# Patient Record
Sex: Female | Born: 1955 | Race: White | Hispanic: No | Marital: Married | Smoking: Never smoker
Health system: Southern US, Community
[De-identification: ages and names within clinical notes are randomized; demographics above are authoritative.]

## PROBLEM LIST (undated history)

## (undated) DIAGNOSIS — K219 Gastro-esophageal reflux disease without esophagitis: Secondary | ICD-10-CM

## (undated) DIAGNOSIS — R011 Cardiac murmur, unspecified: Secondary | ICD-10-CM

## (undated) DIAGNOSIS — J45909 Unspecified asthma, uncomplicated: Secondary | ICD-10-CM

## (undated) DIAGNOSIS — T7840XA Allergy, unspecified, initial encounter: Secondary | ICD-10-CM

## (undated) DIAGNOSIS — E785 Hyperlipidemia, unspecified: Secondary | ICD-10-CM

## (undated) DIAGNOSIS — Z5189 Encounter for other specified aftercare: Secondary | ICD-10-CM

## (undated) DIAGNOSIS — E079 Disorder of thyroid, unspecified: Secondary | ICD-10-CM

## (undated) DIAGNOSIS — M1990 Unspecified osteoarthritis, unspecified site: Secondary | ICD-10-CM

## (undated) DIAGNOSIS — I10 Essential (primary) hypertension: Secondary | ICD-10-CM

## (undated) HISTORY — PX: APPENDECTOMY: SHX54

## (undated) HISTORY — DX: Unspecified osteoarthritis, unspecified site: M19.90

## (undated) HISTORY — PX: ABDOMINAL HYSTERECTOMY: SHX81

## (undated) HISTORY — DX: Disorder of thyroid, unspecified: E07.9

## (undated) HISTORY — DX: Essential (primary) hypertension: I10

## (undated) HISTORY — DX: Allergy, unspecified, initial encounter: T78.40XA

## (undated) HISTORY — DX: Unspecified asthma, uncomplicated: J45.909

## (undated) HISTORY — DX: Hyperlipidemia, unspecified: E78.5

## (undated) HISTORY — DX: Encounter for other specified aftercare: Z51.89

## (undated) HISTORY — DX: Gastro-esophageal reflux disease without esophagitis: K21.9

## (undated) HISTORY — DX: Cardiac murmur, unspecified: R01.1

---

## 2016-07-27 DIAGNOSIS — Z23 Encounter for immunization: Secondary | ICD-10-CM | POA: Diagnosis not present

## 2016-08-01 DIAGNOSIS — I10 Essential (primary) hypertension: Secondary | ICD-10-CM | POA: Diagnosis not present

## 2016-08-01 DIAGNOSIS — M170 Bilateral primary osteoarthritis of knee: Secondary | ICD-10-CM | POA: Diagnosis not present

## 2016-08-01 DIAGNOSIS — Z23 Encounter for immunization: Secondary | ICD-10-CM | POA: Diagnosis not present

## 2016-08-01 DIAGNOSIS — E039 Hypothyroidism, unspecified: Secondary | ICD-10-CM | POA: Diagnosis not present

## 2016-08-01 DIAGNOSIS — E7800 Pure hypercholesterolemia, unspecified: Secondary | ICD-10-CM | POA: Diagnosis not present

## 2016-09-06 DIAGNOSIS — D490 Neoplasm of unspecified behavior of digestive system: Secondary | ICD-10-CM | POA: Diagnosis not present

## 2016-09-06 DIAGNOSIS — D126 Benign neoplasm of colon, unspecified: Secondary | ICD-10-CM | POA: Insufficient documentation

## 2016-09-06 DIAGNOSIS — Z800 Family history of malignant neoplasm of digestive organs: Secondary | ICD-10-CM | POA: Diagnosis not present

## 2016-09-06 DIAGNOSIS — K6289 Other specified diseases of anus and rectum: Secondary | ICD-10-CM | POA: Diagnosis not present

## 2016-09-06 DIAGNOSIS — D123 Benign neoplasm of transverse colon: Secondary | ICD-10-CM | POA: Diagnosis not present

## 2016-09-06 DIAGNOSIS — Z1211 Encounter for screening for malignant neoplasm of colon: Secondary | ICD-10-CM | POA: Diagnosis not present

## 2016-09-07 DIAGNOSIS — K629 Disease of anus and rectum, unspecified: Secondary | ICD-10-CM | POA: Diagnosis not present

## 2016-09-07 DIAGNOSIS — K6289 Other specified diseases of anus and rectum: Secondary | ICD-10-CM | POA: Insufficient documentation

## 2016-09-17 DIAGNOSIS — E039 Hypothyroidism, unspecified: Secondary | ICD-10-CM | POA: Diagnosis not present

## 2016-09-17 DIAGNOSIS — I10 Essential (primary) hypertension: Secondary | ICD-10-CM | POA: Diagnosis not present

## 2016-09-17 DIAGNOSIS — Z6841 Body Mass Index (BMI) 40.0 and over, adult: Secondary | ICD-10-CM | POA: Diagnosis not present

## 2016-09-17 DIAGNOSIS — D128 Benign neoplasm of rectum: Secondary | ICD-10-CM | POA: Diagnosis not present

## 2016-09-17 DIAGNOSIS — Z01818 Encounter for other preprocedural examination: Secondary | ICD-10-CM | POA: Diagnosis not present

## 2016-09-19 DIAGNOSIS — D128 Benign neoplasm of rectum: Secondary | ICD-10-CM | POA: Diagnosis not present

## 2016-09-19 DIAGNOSIS — E039 Hypothyroidism, unspecified: Secondary | ICD-10-CM | POA: Diagnosis not present

## 2016-09-19 DIAGNOSIS — Z6839 Body mass index (BMI) 39.0-39.9, adult: Secondary | ICD-10-CM | POA: Diagnosis not present

## 2016-09-19 DIAGNOSIS — K629 Disease of anus and rectum, unspecified: Secondary | ICD-10-CM | POA: Diagnosis not present

## 2016-09-19 DIAGNOSIS — Z79899 Other long term (current) drug therapy: Secondary | ICD-10-CM | POA: Diagnosis not present

## 2016-09-19 DIAGNOSIS — I10 Essential (primary) hypertension: Secondary | ICD-10-CM | POA: Diagnosis not present

## 2016-09-19 DIAGNOSIS — Z791 Long term (current) use of non-steroidal anti-inflammatories (NSAID): Secondary | ICD-10-CM | POA: Diagnosis not present

## 2016-09-20 DIAGNOSIS — D128 Benign neoplasm of rectum: Secondary | ICD-10-CM | POA: Diagnosis not present

## 2016-09-20 DIAGNOSIS — Z79899 Other long term (current) drug therapy: Secondary | ICD-10-CM | POA: Diagnosis not present

## 2016-09-20 DIAGNOSIS — Z791 Long term (current) use of non-steroidal anti-inflammatories (NSAID): Secondary | ICD-10-CM | POA: Diagnosis not present

## 2016-09-20 DIAGNOSIS — E039 Hypothyroidism, unspecified: Secondary | ICD-10-CM | POA: Diagnosis not present

## 2016-09-20 DIAGNOSIS — I10 Essential (primary) hypertension: Secondary | ICD-10-CM | POA: Diagnosis not present

## 2016-09-20 DIAGNOSIS — Z6839 Body mass index (BMI) 39.0-39.9, adult: Secondary | ICD-10-CM | POA: Diagnosis not present

## 2016-10-30 DIAGNOSIS — Z1231 Encounter for screening mammogram for malignant neoplasm of breast: Secondary | ICD-10-CM | POA: Diagnosis not present

## 2016-12-03 DIAGNOSIS — Z9889 Other specified postprocedural states: Secondary | ICD-10-CM | POA: Diagnosis not present

## 2016-12-03 DIAGNOSIS — Z8719 Personal history of other diseases of the digestive system: Secondary | ICD-10-CM | POA: Diagnosis not present

## 2017-02-11 DIAGNOSIS — Z0000 Encounter for general adult medical examination without abnormal findings: Secondary | ICD-10-CM | POA: Diagnosis not present

## 2017-02-11 DIAGNOSIS — I10 Essential (primary) hypertension: Secondary | ICD-10-CM | POA: Diagnosis not present

## 2017-02-11 DIAGNOSIS — Z1231 Encounter for screening mammogram for malignant neoplasm of breast: Secondary | ICD-10-CM | POA: Diagnosis not present

## 2017-02-11 DIAGNOSIS — E039 Hypothyroidism, unspecified: Secondary | ICD-10-CM | POA: Diagnosis not present

## 2017-02-11 DIAGNOSIS — E7800 Pure hypercholesterolemia, unspecified: Secondary | ICD-10-CM | POA: Diagnosis not present

## 2017-02-11 DIAGNOSIS — M170 Bilateral primary osteoarthritis of knee: Secondary | ICD-10-CM | POA: Diagnosis not present

## 2017-02-11 DIAGNOSIS — M8580 Other specified disorders of bone density and structure, unspecified site: Secondary | ICD-10-CM | POA: Diagnosis not present

## 2017-05-08 DIAGNOSIS — M8580 Other specified disorders of bone density and structure, unspecified site: Secondary | ICD-10-CM | POA: Diagnosis not present

## 2017-05-08 DIAGNOSIS — B354 Tinea corporis: Secondary | ICD-10-CM | POA: Diagnosis not present

## 2017-05-08 DIAGNOSIS — Z0000 Encounter for general adult medical examination without abnormal findings: Secondary | ICD-10-CM | POA: Diagnosis not present

## 2017-08-20 DIAGNOSIS — E039 Hypothyroidism, unspecified: Secondary | ICD-10-CM | POA: Diagnosis not present

## 2017-08-20 DIAGNOSIS — Z23 Encounter for immunization: Secondary | ICD-10-CM | POA: Diagnosis not present

## 2017-08-20 DIAGNOSIS — E7800 Pure hypercholesterolemia, unspecified: Secondary | ICD-10-CM | POA: Diagnosis not present

## 2017-08-20 DIAGNOSIS — M8580 Other specified disorders of bone density and structure, unspecified site: Secondary | ICD-10-CM | POA: Diagnosis not present

## 2017-08-20 DIAGNOSIS — I10 Essential (primary) hypertension: Secondary | ICD-10-CM | POA: Diagnosis not present

## 2017-11-01 DIAGNOSIS — Z1231 Encounter for screening mammogram for malignant neoplasm of breast: Secondary | ICD-10-CM | POA: Diagnosis not present

## 2018-03-12 DIAGNOSIS — Z1231 Encounter for screening mammogram for malignant neoplasm of breast: Secondary | ICD-10-CM | POA: Diagnosis not present

## 2018-03-12 DIAGNOSIS — I10 Essential (primary) hypertension: Secondary | ICD-10-CM | POA: Diagnosis not present

## 2018-03-12 DIAGNOSIS — E039 Hypothyroidism, unspecified: Secondary | ICD-10-CM | POA: Diagnosis not present

## 2018-03-12 DIAGNOSIS — Z0000 Encounter for general adult medical examination without abnormal findings: Secondary | ICD-10-CM | POA: Diagnosis not present

## 2018-03-12 DIAGNOSIS — M8580 Other specified disorders of bone density and structure, unspecified site: Secondary | ICD-10-CM | POA: Diagnosis not present

## 2018-03-12 DIAGNOSIS — E7800 Pure hypercholesterolemia, unspecified: Secondary | ICD-10-CM | POA: Diagnosis not present

## 2018-03-12 DIAGNOSIS — Z23 Encounter for immunization: Secondary | ICD-10-CM | POA: Diagnosis not present

## 2018-03-12 DIAGNOSIS — Z01419 Encounter for gynecological examination (general) (routine) without abnormal findings: Secondary | ICD-10-CM | POA: Diagnosis not present

## 2018-05-21 DIAGNOSIS — Z6839 Body mass index (BMI) 39.0-39.9, adult: Secondary | ICD-10-CM | POA: Diagnosis not present

## 2018-05-21 DIAGNOSIS — I10 Essential (primary) hypertension: Secondary | ICD-10-CM | POA: Diagnosis not present

## 2018-05-21 DIAGNOSIS — J40 Bronchitis, not specified as acute or chronic: Secondary | ICD-10-CM | POA: Diagnosis not present

## 2018-05-28 DIAGNOSIS — J40 Bronchitis, not specified as acute or chronic: Secondary | ICD-10-CM | POA: Diagnosis not present

## 2018-05-28 DIAGNOSIS — R05 Cough: Secondary | ICD-10-CM | POA: Diagnosis not present

## 2018-07-31 DIAGNOSIS — Z23 Encounter for immunization: Secondary | ICD-10-CM | POA: Diagnosis not present

## 2018-09-17 DIAGNOSIS — E559 Vitamin D deficiency, unspecified: Secondary | ICD-10-CM | POA: Diagnosis not present

## 2018-09-17 DIAGNOSIS — E039 Hypothyroidism, unspecified: Secondary | ICD-10-CM | POA: Diagnosis not present

## 2018-09-17 DIAGNOSIS — E7800 Pure hypercholesterolemia, unspecified: Secondary | ICD-10-CM | POA: Diagnosis not present

## 2018-09-17 DIAGNOSIS — M8580 Other specified disorders of bone density and structure, unspecified site: Secondary | ICD-10-CM | POA: Diagnosis not present

## 2018-09-17 DIAGNOSIS — I10 Essential (primary) hypertension: Secondary | ICD-10-CM | POA: Diagnosis not present

## 2018-11-27 DIAGNOSIS — Z1231 Encounter for screening mammogram for malignant neoplasm of breast: Secondary | ICD-10-CM | POA: Diagnosis not present

## 2019-01-29 DIAGNOSIS — Z0000 Encounter for general adult medical examination without abnormal findings: Secondary | ICD-10-CM | POA: Diagnosis not present

## 2019-03-18 DIAGNOSIS — E7800 Pure hypercholesterolemia, unspecified: Secondary | ICD-10-CM | POA: Diagnosis not present

## 2019-03-18 DIAGNOSIS — Z01419 Encounter for gynecological examination (general) (routine) without abnormal findings: Secondary | ICD-10-CM | POA: Diagnosis not present

## 2019-03-18 DIAGNOSIS — Z0000 Encounter for general adult medical examination without abnormal findings: Secondary | ICD-10-CM | POA: Diagnosis not present

## 2019-03-18 DIAGNOSIS — E559 Vitamin D deficiency, unspecified: Secondary | ICD-10-CM | POA: Diagnosis not present

## 2019-03-18 DIAGNOSIS — I10 Essential (primary) hypertension: Secondary | ICD-10-CM | POA: Diagnosis not present

## 2019-03-18 DIAGNOSIS — E039 Hypothyroidism, unspecified: Secondary | ICD-10-CM | POA: Diagnosis not present

## 2020-01-30 ENCOUNTER — Ambulatory Visit: Payer: Self-pay | Attending: Internal Medicine

## 2020-01-30 DIAGNOSIS — Z23 Encounter for immunization: Secondary | ICD-10-CM

## 2020-01-30 NOTE — Progress Notes (Signed)
   Covid-19 Vaccination Clinic  Name:  BREIANA ECKHARDT    MRN: [id number] DOB: 22-Sep-1956  01/30/2020  Ms. Keysor was observed post Covid-19 immunization for 15 minutes without incident. She was provided with Vaccine Information Sheet and instruction to access the V-Safe system.   Ms. Jerge was instructed to call 911 with any severe reactions post vaccine: Marland Kitchen Difficulty breathing  . Swelling of face and throat  . A fast heartbeat  . A bad rash all over body  . Dizziness and weakness   Immunizations Administered    Name Date Dose VIS Date Route   Moderna COVID-19 Vaccine 01/30/2020 10:28 AM 0.5 mL 10/13/2019 Intramuscular   Manufacturer: Moderna   Lot: [id number]   [place][id number]

## 2020-03-02 ENCOUNTER — Ambulatory Visit: Payer: Self-pay | Attending: Internal Medicine

## 2020-03-02 DIAGNOSIS — Z23 Encounter for immunization: Secondary | ICD-10-CM

## 2020-03-02 NOTE — Progress Notes (Signed)
   Covid-19 Vaccination Clinic  Name:  Sheila Kerr    MRN: [id number] DOB: 1956/06/13  03/02/2020  [name] was observed post Covid-19 immunization for 15 minutes without incident. She was provided with Vaccine Information Sheet and instruction to access the V-Safe system.   [name] was instructed to call 911 with any severe reactions post vaccine: Marland Kitchen Difficulty breathing  . Swelling of face and throat  . A fast heartbeat  . A bad rash all over body  . Dizziness and weakness   Immunizations Administered    Name Date Dose VIS Date Route   Moderna COVID-19 Vaccine 03/02/2020 10:10 AM 0.5 mL 10/2019 Intramuscular   Manufacturer: Moderna   Lot: [id number]   [place]:[id number]

## 2020-07-26 ENCOUNTER — Ambulatory Visit: Payer: Medicare HMO | Admitting: Podiatry

## 2020-07-26 ENCOUNTER — Other Ambulatory Visit: Payer: Self-pay

## 2020-07-26 ENCOUNTER — Ambulatory Visit (INDEPENDENT_AMBULATORY_CARE_PROVIDER_SITE_OTHER): Payer: Medicare HMO

## 2020-07-26 DIAGNOSIS — R0989 Other specified symptoms and signs involving the circulatory and respiratory systems: Secondary | ICD-10-CM | POA: Diagnosis not present

## 2020-07-26 DIAGNOSIS — L97511 Non-pressure chronic ulcer of other part of right foot limited to breakdown of skin: Secondary | ICD-10-CM | POA: Diagnosis not present

## 2020-07-26 MED ORDER — MUPIROCIN 2 % EX OINT: TOPICAL_OINTMENT | CUTANEOUS | 1 refills | Status: DC

## 2020-07-27 NOTE — Progress Notes (Signed)
Subjective:  Patient ID: Sheila Kerr, female    DOB: [birth year],  MRN: [id number] HPI Chief Complaint  Patient presents with  . Toe Pain    3rd toe right - ulcer tip of toe x 1 year, initially thought bug bite, blistered then opened up, been seeing podiatrist-they've been trimming, recommended using pumice at home, silvadene cream and covering, doc recommended tenotomy but wasn't sure  . New Patient (Initial Visit)    64 y.o. female presents with the above complaint.   ROS: Denies fever chills nausea vomiting muscle aches pains calf pain back pain chest pain shortness of breath.  She denies history of diabetes states that she was just at her primary doctors and June and her primary Dr. Dory Larsen said that her blood work was perfect no signs of diabetes.  No past medical history on file.   Current Outpatient Medications:  .  fexofenadine-pseudoephedrine (ALLEGRA-D 24) 180-240 MG 24 hr tablet, Take 1 tablet by mouth daily., Disp: , Rfl:  .  influenza vac split quadrivalent PF (FLUARIX QUADRIVALENT) 0.5 ML injection, TO BE ADMINISTERED BY PHARMACIST FOR IMMUNIZATION, Disp: , Rfl:  .  Selenium (SELENOMAX PO), Take by mouth., Disp: , Rfl:  .  silver sulfADIAZINE (SILVADENE) 1 % cream, Apply 1 application topically daily., Disp: , Rfl:  .  albuterol (VENTOLIN HFA) 108 (90 Base) MCG/ACT inhaler, , Disp: , Rfl:  .  diclofenac (VOLTAREN) 50 MG EC tablet, , Disp: , Rfl:  .  ezetimibe (ZETIA) 10 MG tablet, , Disp: , Rfl:  .  hydrochlorothiazide (HYDRODIURIL) 12.5 MG tablet, , Disp: , Rfl:  .  levothyroxine (SYNTHROID) 100 MCG tablet, , Disp: , Rfl:  .  lisinopril (ZESTRIL) 20 MG tablet, , Disp: , Rfl:  .  mupirocin ointment (BACTROBAN) 2 %, Apply to wound after soaking BID, Disp: 30 g, Rfl: 1  Allergies  Allergen Reactions  . Adhesive [Tape]    Review of Systems Objective:  There were no vitals filed for this visit.  General: Well developed, nourished, in no acute distress, alert  and oriented x3   Dermatological: Skin is warm, dry and supple bilateral. Nails x 10 are well maintained; remaining integument appears unremarkable at this time. There are no open sores, no preulcerative lesions, no rash or signs of infection present.  She does have a reactive wound to the third digit of the right foot distally measures about 0.6 cm in diameter does not probe deep does demonstrate a distal clavus just superior to the open lesion.  Also nail dystrophy.  Vascular: Dorsalis Pedis artery and Posterior Tibial artery pedal pulses are 1/4 bilateral with sluggish capillary fill time. Pedal hair growth present. No varicosities and no lower extremity edema present bilateral.   Neruologic: Grossly intact via light touch bilateral. Vibratory intact via tuning fork bilateral. Protective threshold with Semmes Wienstein monofilament intact to all pedal sites bilateral. Patellar and Achilles deep tendon reflexes 2+ bilateral. No Babinski or clonus noted bilateral.   Musculoskeletal: No gross boney pedal deformities bilateral. No pain, crepitus, or limitation noted with foot and ankle range of motion bilateral. Muscular strength 5/5 in all groups tested bilateral.  Flexible hammertoe deformity.  Gait: Unassisted, Nonantalgic.    Radiographs:  Radiographs taken today demonstrate hammertoe deformities right foot.  Evaluation of the distal aspect of the third toe in particular does not demonstrate any type of osseous abnormalities consistent with osteomyelitis.  Hammertoe is visible.  Assessment & Plan:   Assessment: Cannot rule out peripheral vascular  disease at least at the microvascular level.  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  Plan: Debrided reactive hyperkeratotic tissue debrided ulceration today.  Demonstrated for her to dress the wound daily and also a silicone buttress pad.  We will send her for vascular evaluation I will follow-up with her  once this is complete consider a flexor tenotomy.     Adoni Greenough T. Le[place], [place]

## 2020-08-02 ENCOUNTER — Other Ambulatory Visit: Payer: Self-pay

## 2020-08-02 ENCOUNTER — Ambulatory Visit (HOSPITAL_COMMUNITY)
Admission: RE | Admit: 2020-08-02 | Discharge: 2020-08-02 | Disposition: A | Discharge status: Home / Self Care | Payer: Medicare HMO | Source: Ambulatory Visit | Attending: Podiatry | Admitting: Podiatry

## 2020-08-02 DIAGNOSIS — R0989 Other specified symptoms and signs involving the circulatory and respiratory systems: Secondary | ICD-10-CM | POA: Diagnosis not present

## 2020-08-02 DIAGNOSIS — L97511 Non-pressure chronic ulcer of other part of right foot limited to breakdown of skin: Secondary | ICD-10-CM | POA: Diagnosis not present

## 2020-08-04 ENCOUNTER — Telehealth: Payer: Self-pay | Admitting: *Deleted

## 2020-08-04 NOTE — Telephone Encounter (Signed)
-----   Message from Garrel Ridgel, [place] sent at 08/04/2020 12:21 PM EDT ----- Vascular eval normal.

## 2020-08-04 NOTE — Telephone Encounter (Signed)
Patient has an appt on 10/7

## 2020-08-18 ENCOUNTER — Encounter: Payer: Self-pay | Admitting: Podiatry

## 2020-08-18 ENCOUNTER — Other Ambulatory Visit: Payer: Self-pay

## 2020-08-18 ENCOUNTER — Ambulatory Visit: Payer: Medicare HMO | Admitting: Podiatry

## 2020-08-18 DIAGNOSIS — L97511 Non-pressure chronic ulcer of other part of right foot limited to breakdown of skin: Secondary | ICD-10-CM | POA: Diagnosis not present

## 2020-08-18 DIAGNOSIS — M205X1 Other deformities of toe(s) (acquired), right foot: Secondary | ICD-10-CM

## 2020-08-18 NOTE — Progress Notes (Signed)
She presents today for follow-up of ulceration distal aspect third toe right foot.  States this seems to be getting better.  She presents today for her vascular exam follow-up as well states that they did not tell me anything when they did the test.  Objective: Vital signs are stable she is alert oriented x3 there is no erythema edema cellulitis drainage or hyperkeratotic lesion to the distal aspect of the toe was debrided demonstrates ulceration which does not demonstrate any probing deep to bone and it does not demonstrate purulence malodor.  Assessment: Normal vascular studies with a slowly healing ulcerative lesion to a hammertoe third right.  Plan: At this point we will continue to treat the toe and keeping the toe elevated and compression off of the end of it so that we can perform a tenotomy I will follow-up with her in about 6 weeks for tenotomy.

## 2020-09-27 ENCOUNTER — Other Ambulatory Visit: Payer: Self-pay

## 2020-09-27 ENCOUNTER — Ambulatory Visit: Payer: Medicare HMO | Admitting: Podiatry

## 2020-09-27 DIAGNOSIS — L97511 Non-pressure chronic ulcer of other part of right foot limited to breakdown of skin: Secondary | ICD-10-CM | POA: Diagnosis not present

## 2020-09-27 DIAGNOSIS — M205X1 Other deformities of toe(s) (acquired), right foot: Secondary | ICD-10-CM

## 2020-09-27 NOTE — Progress Notes (Signed)
She presents today for follow-up of a distal ulceration to the third toe right foot.  We were hoping to perform a flexor tenotomy today.  She states that she continues to wear her sulcus pad to help keep the toe elevated.  She states that it seems to be doing much better.  Objective: Vital signs are stable alert oriented x3 toe is mildly erythematous with a distal clavus.  Once the distal clavus was debrided it appears that the majority of the wound that was present last time has gone on to heal up.  There is just a very small area that appears to be slightly deepened but if there is no cellulitis or drainage or odor from that spot.  Assessment: Hammertoe with ulceration healing.  Plan: I hesitate to perform the tenotomy today simply because of the small area that can still be harboring infection.  At this point I am going to debride the wound today again noting that there is no purulence no malodor it appears to be healing.  At this point she will continue to wear her sulcus pad/digital buttress in order to allow this to heal 100%.  I will follow-up with her in 2 weeks at which time we will schedule her a flexor tenotomy.

## 2020-10-11 ENCOUNTER — Ambulatory Visit: Payer: Medicare HMO | Admitting: Podiatry

## 2020-10-11 ENCOUNTER — Other Ambulatory Visit: Payer: Self-pay

## 2020-10-11 ENCOUNTER — Encounter: Payer: Self-pay | Admitting: Podiatry

## 2020-10-11 DIAGNOSIS — M205X1 Other deformities of toe(s) (acquired), right foot: Secondary | ICD-10-CM | POA: Diagnosis not present

## 2020-10-11 NOTE — Progress Notes (Signed)
She presents today states that my toe is doing better and she would like to have the tenotomy performed today.  Objective: Vital signs are stable she alert oriented x3 distal clavus and ulceration has gone on to heal uneventfully third digit right foot. Pulses are strongly palpable.  No erythema edema cellulitis drainage or odor.  Assessment: Flexible hammertoe deformity contracted flexor tendon.  Well-healed ulceration.  Plan: Discussed etiology pathology conservative surgical therapies this point time I highly recommended performing a flexor tenotomy.  She signed a consent form for that today.  Localized the toe at the level of the metatarsophalangeal joint with 3 cc 50-50 mixture of Marcaine plain and lidocaine with epinephrine.  Tolerated procedure well after sterile Betadine skin prep.  A small single nylon stitch was placed and then a dry sterile compressive dressing and a Darco shoe will follow up with her in 1 to 2 weeks for suture removal.

## 2020-10-11 NOTE — Patient Instructions (Signed)
Leave bandage in place and dry for 3-4 days, then remove. You may wash foot normally after removal of bandage. DO NOT SOAK FOOT! Dry completely afterwards and may use a bandaid over incision if needed. We will follow up with you in 1 weeks for recheck.  

## 2020-10-20 ENCOUNTER — Ambulatory Visit: Payer: Medicare HMO | Admitting: Podiatry

## 2020-10-20 ENCOUNTER — Other Ambulatory Visit: Payer: Self-pay

## 2020-10-20 DIAGNOSIS — M205X1 Other deformities of toe(s) (acquired), right foot: Secondary | ICD-10-CM

## 2020-10-20 NOTE — Progress Notes (Signed)
She presents today for follow-up of her contracted digit which we performed a flexor tenotomy on.  She denies fever chills nausea vomiting muscle aches and pain states that seems to be doing really well.  Happy with the outcome.  The distal wound is healing up she says.  Objective: Vital signs are stable alert and oriented x3 toe sitting rectus stitches intact once the stitch was removed margins remain well coapted toe is rectus and flexible.  The wound to the distal aspect of the toes 100% healed.  Assessment: Well-healing surgical toe.  Plan: Follow-up with me on an as-needed basis.  May need to consider toes #2 and 4.

## 2021-02-21 ENCOUNTER — Other Ambulatory Visit (HOSPITAL_COMMUNITY): Payer: Self-pay | Admitting: Unknown Physician Specialty

## 2021-02-21 DIAGNOSIS — J209 Acute bronchitis, unspecified: Secondary | ICD-10-CM

## 2021-03-16 ENCOUNTER — Ambulatory Visit (INDEPENDENT_AMBULATORY_CARE_PROVIDER_SITE_OTHER): Payer: Medicare HMO

## 2021-03-16 ENCOUNTER — Encounter: Payer: Self-pay | Admitting: Podiatry

## 2021-03-16 ENCOUNTER — Other Ambulatory Visit: Payer: Self-pay

## 2021-03-16 ENCOUNTER — Ambulatory Visit: Payer: Medicare HMO | Admitting: Podiatry

## 2021-03-16 DIAGNOSIS — S92325A Nondisplaced fracture of second metatarsal bone, left foot, initial encounter for closed fracture: Secondary | ICD-10-CM | POA: Diagnosis not present

## 2021-03-16 DIAGNOSIS — M19072 Primary osteoarthritis, left ankle and foot: Secondary | ICD-10-CM

## 2021-03-16 DIAGNOSIS — M778 Other enthesopathies, not elsewhere classified: Secondary | ICD-10-CM

## 2021-03-16 NOTE — Progress Notes (Signed)
She presents today chief complaint of pain across the dorsal aspect of the foot and the medial side of the foot.  She states is been some swelling and aching for the past 3 months denies any injury and states that the swelling is intermittent.  Objective: Vital signs are stable alert oriented x3.  Pulses are palpable.  She has a large palpable mass firm in nature medial aspect of the left foot.  She has swelling across the dorsum of the foot all of the tendons are intact the extensors tibialis anterior tendon and tibialis posterior are intact.  She has pain on palpation at the first metatarsal medial cuneiform joint and the base of the second metatarsal.  Radiographs taken today demonstrates osteoarthritic changes at the first metatarsophalangeal joint with increase in soft tissue density surrounding the joint medially and dorsally.  Also there is a fracture nondisplaced second metatarsal proximal.  And possible fracture of the second toe at the base of the proximal phalanx.  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  Plan: Discussed etiology pathology conservative surgical therapies at this point I placed her in a cam walker for the next 4 weeks follow-up with her for another set of x-rays at that time.

## 2021-04-20 ENCOUNTER — Encounter: Payer: Self-pay | Admitting: Podiatry

## 2021-04-20 ENCOUNTER — Ambulatory Visit (INDEPENDENT_AMBULATORY_CARE_PROVIDER_SITE_OTHER): Payer: Medicare HMO | Admitting: Podiatry

## 2021-04-20 ENCOUNTER — Other Ambulatory Visit: Payer: Self-pay | Admitting: Podiatry

## 2021-04-20 ENCOUNTER — Ambulatory Visit (INDEPENDENT_AMBULATORY_CARE_PROVIDER_SITE_OTHER): Payer: Medicare HMO

## 2021-04-20 ENCOUNTER — Other Ambulatory Visit: Payer: Self-pay

## 2021-04-20 DIAGNOSIS — S92325D Nondisplaced fracture of second metatarsal bone, left foot, subsequent encounter for fracture with routine healing: Secondary | ICD-10-CM

## 2021-04-20 DIAGNOSIS — S92325A Nondisplaced fracture of second metatarsal bone, left foot, initial encounter for closed fracture: Secondary | ICD-10-CM

## 2021-04-23 NOTE — Progress Notes (Signed)
She presents today for follow-up of her Lisfranc's injury.  And the proximal base of the second toe.  She states that he feels some better.  Objective: Vital signs stable alert oriented x3.  Pulses are palpable.  She has still some swelling some pain on range of motion of the second metatarsal phalangeal joint.  Radiographs taken do demonstrate some healing most likely this is ongoing to a nonunion or and arthritis of the second metatarsophalangeal joint.  Assessment: Fracture second metatarsal phalangeal joint.  Plan: Follow-up with me 1 month.

## 2021-05-18 ENCOUNTER — Ambulatory Visit: Payer: Medicare HMO | Admitting: Podiatry

## 2021-05-18 ENCOUNTER — Encounter: Payer: Self-pay | Admitting: Podiatry

## 2021-05-18 ENCOUNTER — Ambulatory Visit (INDEPENDENT_AMBULATORY_CARE_PROVIDER_SITE_OTHER): Payer: Medicare HMO

## 2021-05-18 ENCOUNTER — Other Ambulatory Visit: Payer: Self-pay

## 2021-05-18 DIAGNOSIS — S92325D Nondisplaced fracture of second metatarsal bone, left foot, subsequent encounter for fracture with routine healing: Secondary | ICD-10-CM | POA: Diagnosis not present

## 2021-05-18 DIAGNOSIS — M778 Other enthesopathies, not elsewhere classified: Secondary | ICD-10-CM | POA: Diagnosis not present

## 2021-05-18 DIAGNOSIS — M19072 Primary osteoarthritis, left ankle and foot: Secondary | ICD-10-CM | POA: Diagnosis not present

## 2021-05-18 MED ORDER — TRIAMCINOLONE ACETONIDE 40 MG/ML IJ SUSP
20.0000 mg | Freq: Once | INTRAMUSCULAR | Status: AC
  Administered 2021-05-18: 20 mg

## 2021-05-19 NOTE — Progress Notes (Signed)
She presents today once again for follow-up of her painful fracture of the second metatarsal near the base.  She says I really think is the arthritis that bothers me more than anything else and is starting to affect my ability perform her daily activities and maintain my general good health.  States that she can wears the boot most of the time.  Objective: Vital signs are stable alert and oriented x3.  Pulses are palpable.  She has moderate to severe pain on palpation in frontal plane range of motion of the tarsometatarsal joints there is dorsal spurring noted subchondral sclerosis noted on radiographic evaluation taken today do not see any worsening of her fracture at the base of the second metatarsal but I do think the osteoarthritic changes are progressing.  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  Plan: Follow-up with a CT scan left midfoot forearm severe osteoarthritis a evaluation differential diagnosis and treatment possibilities.  I also injected the area today with Kenalog and local anesthetic to help alleviate her symptoms until CT can be performed and we can have this reevaluated.

## 2021-06-08 ENCOUNTER — Ambulatory Visit
Admission: RE | Admit: 2021-06-08 | Discharge: 2021-06-08 | Disposition: A | Discharge status: Home / Self Care | Payer: Medicare HMO | Source: Ambulatory Visit | Attending: Podiatry | Admitting: Podiatry

## 2021-06-08 ENCOUNTER — Other Ambulatory Visit: Payer: Self-pay

## 2021-06-08 DIAGNOSIS — M19072 Primary osteoarthritis, left ankle and foot: Secondary | ICD-10-CM

## 2021-06-27 ENCOUNTER — Ambulatory Visit (INDEPENDENT_AMBULATORY_CARE_PROVIDER_SITE_OTHER): Payer: Medicare HMO | Admitting: Podiatry

## 2021-06-27 ENCOUNTER — Other Ambulatory Visit: Payer: Self-pay

## 2021-06-27 DIAGNOSIS — S92325D Nondisplaced fracture of second metatarsal bone, left foot, subsequent encounter for fracture with routine healing: Secondary | ICD-10-CM | POA: Diagnosis not present

## 2021-06-27 DIAGNOSIS — M19072 Primary osteoarthritis, left ankle and foot: Secondary | ICD-10-CM | POA: Diagnosis not present

## 2021-06-28 NOTE — Progress Notes (Signed)
She presents today for follow-up of her CT with her husband Annie Main.  States that the foot is still painful for her.  Objective: Vital signs are stable she is alert and oriented x3.  Pulses are palpable.  CT does demonstrate significant osteoarthritic changes with previously nondisplaced fractures of the base of the first metatarsal with significant osteoarthritic change there the widening at Lisfranc's joint and consistent with a previous injury and then a mildly displaced fracture along the dorsal lateral aspect of the lateral cuneiforms.  Most all of this is associated with severe osteoarthritic change.  Assessment: Severe osteoarthritis left foot.  Plan: At this point we discussed with her a considerable depth about fusing the first metatarsal medial cuneiform joint and trying to reduce Lisfranc's separation.  We discussed the length of time being anywhere from 8 to 12 weeks casted nonambulatory she understands this is amendable to it is trying to decide whether or not she wants to have this done she may seek second opinion I am going to follow-up with her on an as-needed basis.  More than likely would need Dr. Sherryle Lis to evaluate as well and to assess.

## 2021-11-14 ENCOUNTER — Other Ambulatory Visit: Payer: Self-pay

## 2021-11-14 ENCOUNTER — Ambulatory Visit: Payer: Medicare HMO | Admitting: Podiatry

## 2021-11-14 ENCOUNTER — Encounter: Payer: Self-pay | Admitting: Podiatry

## 2021-11-14 DIAGNOSIS — M21962 Unspecified acquired deformity of left lower leg: Secondary | ICD-10-CM

## 2021-11-14 DIAGNOSIS — M2012 Hallux valgus (acquired), left foot: Secondary | ICD-10-CM | POA: Diagnosis not present

## 2021-11-14 DIAGNOSIS — M2042 Other hammer toe(s) (acquired), left foot: Secondary | ICD-10-CM | POA: Diagnosis not present

## 2021-11-15 NOTE — Progress Notes (Signed)
She presents today to discuss surgical intervention of her left foot.  States that it has not improved at all.  Her husband presented with her today.  Objective: Vital signs are stable alert and oriented x3.  Pulses are palpable.  Neurologic sensorium is intact deep tendon reflexes are intact, muscle strength is normal symmetrical.  The majority of her pain is associated with her first metatarsophalangeal joint area though CT does demonstrate severe osteoarthritis of the entire midfoot.  Assessment: Osteoarthritis midfoot.  Plan: Discussed etiology pathology conservative versus surgical therapies at this point I recommended Lapidus procedure since this is really the only place that is bothering her currently otherwise she would have to go through an entire midfoot fusion.  So at this point we discussed and consented her today for a Lapidus procedure first metatarsal medial cuneiform fusion second and third metatarsal osteotomies with a hammertoe repair second left with pin.  Answered all the questions regarding these procedures best my ability layman's terms she understood and was amenable to it signed with patient's consent form she understands that she will be casted for up to 8 weeks afterwards.  She will probably be out of work for approximately 90 days.  We did discuss the possible need for further surgery due to the fact that she has such severe osteoarthritis of that midfoot.  We discussed the surgical center and anesthesia group I will follow-up with her in the near future for surgical intervention.

## 2021-12-18 ENCOUNTER — Telehealth: Payer: Self-pay | Admitting: Urology

## 2021-12-18 NOTE — Telephone Encounter (Signed)
DOS - 12/22/21  LAPIDUS PROC. INC. BUNIONECTOMY LEFT --- [id number] METATARSAL OSTEOTOMY 2,3 LEFT --- [id number] HAMMERTOE REPAIR 2 LEFT --- [id number]  [facility] EFFECTIVE DATE  - 03/12/20  PER [facility] [employer] CPT CODES 73403, 28308 AND 70964 HAVE BEEN APPROVED, AUTH # [id number], GOOD FROM 12/22/21 - 03/22/22.  TRACKING # [id number]

## 2021-12-21 ENCOUNTER — Other Ambulatory Visit: Payer: Self-pay | Admitting: Podiatry

## 2021-12-21 MED ORDER — ONDANSETRON HCL 4 MG PO TABS: 4.0000 mg | ORAL_TABLET | Freq: Three times a day (TID) | ORAL | 0 refills | Status: DC | PRN

## 2021-12-21 MED ORDER — CLINDAMYCIN HCL 150 MG PO CAPS: 150.0000 mg | ORAL_CAPSULE | Freq: Three times a day (TID) | ORAL | 0 refills | Status: DC

## 2021-12-21 MED ORDER — OXYCODONE-ACETAMINOPHEN 10-325 MG PO TABS: 1.0000 | ORAL_TABLET | Freq: Three times a day (TID) | ORAL | 0 refills | Status: AC | PRN

## 2021-12-22 DIAGNOSIS — M2012 Hallux valgus (acquired), left foot: Secondary | ICD-10-CM | POA: Diagnosis not present

## 2021-12-22 DIAGNOSIS — M21542 Acquired clubfoot, left foot: Secondary | ICD-10-CM | POA: Diagnosis not present

## 2021-12-22 DIAGNOSIS — M2042 Other hammer toe(s) (acquired), left foot: Secondary | ICD-10-CM | POA: Diagnosis not present

## 2021-12-28 ENCOUNTER — Ambulatory Visit (INDEPENDENT_AMBULATORY_CARE_PROVIDER_SITE_OTHER): Payer: Medicare HMO

## 2021-12-28 ENCOUNTER — Encounter: Payer: Self-pay | Admitting: Podiatry

## 2021-12-28 ENCOUNTER — Other Ambulatory Visit: Payer: Self-pay

## 2021-12-28 ENCOUNTER — Ambulatory Visit (INDEPENDENT_AMBULATORY_CARE_PROVIDER_SITE_OTHER): Payer: Medicare HMO | Admitting: Podiatry

## 2021-12-28 VITALS — BP 121/64 | HR 87 | Temp 97.7°F

## 2021-12-28 DIAGNOSIS — M2012 Hallux valgus (acquired), left foot: Secondary | ICD-10-CM | POA: Diagnosis not present

## 2021-12-28 DIAGNOSIS — Z9889 Other specified postprocedural states: Secondary | ICD-10-CM

## 2021-12-28 DIAGNOSIS — M21962 Unspecified acquired deformity of left lower leg: Secondary | ICD-10-CM

## 2021-12-28 DIAGNOSIS — M2042 Other hammer toe(s) (acquired), left foot: Secondary | ICD-10-CM

## 2021-12-28 MED ORDER — VITAMIN D (ERGOCALCIFEROL) 1.25 MG (50000 UNIT) PO CAPS: 50000.0000 [IU] | ORAL_CAPSULE | ORAL | 0 refills | Status: DC

## 2021-12-30 NOTE — Progress Notes (Signed)
She presents today for follow-up of her Lapidus procedure date of surgery is 12/22/2021 left foot Lapidus with severe osteoarthritis second metatarsal third metatarsal osteotomies hammertoe repair with pin second digit.  She denies fever chills nausea vomiting states that has been okay just hard not being able to walk on it.  Objective: Presents today with her husband and knee scooter.  Vital signs blood pressures 121/64 pulse is 87 temperature is 97.7 F.  Cast is intact she has good sensation to the toes she can move the toes with exception of the hallux she does not have a whole lot of plantarflexion or dorsiflexion of the hallux at this point.  The cast is loose at the top and snug distally without edema to the toes.  Radiographs taken today demonstrate internal fixation is in good position and intact.  Assessment well-healing surgical foot 1 week.  Plan: Follow-up with her in 1 week for cast removal she will continue nonweightbearing during this time we will remove the cast next visit and replace it.

## 2022-01-01 ENCOUNTER — Telehealth: Payer: Self-pay | Admitting: *Deleted

## 2022-01-01 ENCOUNTER — Telehealth: Payer: Self-pay | Admitting: Podiatry

## 2022-01-01 NOTE — Telephone Encounter (Signed)
Patient is calling because she started taking a prescribed antibiotic on 10 th and started having symptoms over the weekend of upset stomach, diarrhea w/ some bleeding, chills. Was advised by pharmacy to discontinued. Is there something else she can take. Please advise.

## 2022-01-01 NOTE — Telephone Encounter (Signed)
Patient called and stated she has sx last Friday with Dr. Milinda Pointer and she received an antibiotic and now she is having bloody diarrhea. Patient called the pharmacy and they told her to stop taking it. Patient would like to be prescribed something else..  Please advise

## 2022-01-04 ENCOUNTER — Other Ambulatory Visit: Payer: Self-pay

## 2022-01-04 ENCOUNTER — Ambulatory Visit (INDEPENDENT_AMBULATORY_CARE_PROVIDER_SITE_OTHER): Payer: Medicare HMO | Admitting: Podiatry

## 2022-01-04 ENCOUNTER — Encounter: Payer: Self-pay | Admitting: Podiatry

## 2022-01-04 DIAGNOSIS — M2012 Hallux valgus (acquired), left foot: Secondary | ICD-10-CM

## 2022-01-04 DIAGNOSIS — M21962 Unspecified acquired deformity of left lower leg: Secondary | ICD-10-CM

## 2022-01-04 DIAGNOSIS — Z9889 Other specified postprocedural states: Secondary | ICD-10-CM

## 2022-01-04 DIAGNOSIS — M2042 Other hammer toe(s) (acquired), left foot: Secondary | ICD-10-CM

## 2022-01-06 NOTE — Progress Notes (Signed)
She presents today for follow-up of her Lapidus procedure second metatarsal osteotomy third metatarsal osteotomy and hammertoe repair with pin and cast.  She states that is been feeling fine really have not had much of trouble with that she denies fever chills nausea vomiting muscle aches pains calf pain back pain chest pain shortness of breath.  Objective: Presents with her husband today in a knee scooter.  Cast is intact dry and clean once removed demonstrates dry sterile dressing intact there is been some mild bleeding but has dried.  Once removed sutures are intact there is some mild separation all of the wounds have not healed completely so we will leave the stitches in there is no signs of erythema cellulitis drainage or odor no signs of infection.  Assessment well-healing surgical foot.  Plan: Dressed today dressed a compressive dressing and put it back in a another below-knee cast and I will follow-up with her in 2 weeks at which time the cast will be removed and all sutures will be removed.  K wire will remain intact for another month.  Radiographs will be taken next visit as well.

## 2022-01-18 ENCOUNTER — Ambulatory Visit (INDEPENDENT_AMBULATORY_CARE_PROVIDER_SITE_OTHER): Payer: Medicare HMO

## 2022-01-18 ENCOUNTER — Other Ambulatory Visit: Payer: Self-pay

## 2022-01-18 ENCOUNTER — Encounter: Payer: Self-pay | Admitting: Podiatry

## 2022-01-18 ENCOUNTER — Ambulatory Visit (INDEPENDENT_AMBULATORY_CARE_PROVIDER_SITE_OTHER): Payer: Medicare HMO | Admitting: Podiatry

## 2022-01-18 DIAGNOSIS — M2042 Other hammer toe(s) (acquired), left foot: Secondary | ICD-10-CM

## 2022-01-18 DIAGNOSIS — M2012 Hallux valgus (acquired), left foot: Secondary | ICD-10-CM

## 2022-01-18 DIAGNOSIS — Z9889 Other specified postprocedural states: Secondary | ICD-10-CM

## 2022-01-18 DIAGNOSIS — M21962 Unspecified acquired deformity of left lower leg: Secondary | ICD-10-CM

## 2022-01-18 NOTE — Progress Notes (Signed)
She presents today for her third postop visit she is status post Lapidus procedure second metatarsal osteotomy with hammertoe repair.  She states that she is doing pretty well denies fever chills nausea vomit muscle aches pains calf pain back pain chest pain shortness of breath. ? ?Objective: Presents today with her cast and her knee scooter.  Cast was removed demonstrates quite a bit of swelling in the left foot.  We will leave the sutures in because of the swelling of right foot remove the right now we may end up with wounds to heal.  She still has slightly elevated hallux at the level of metatarsal phalangeal joint.  K wires intact to the second toe all sutures are intact margins appear to be coapting. ? ?Assessment well-healing surgical foot. ? ?Plan: Put her in a Ace compression dressing and into her cam walker.  She will remain nonweightbearing utilizing her knee scooter I will allow her to start cleaning the top of the foot and the leg however she does not get the screw wet.  She understands this is amenable to it understands she continues nonweightbearing status and I will follow-up with her in 2 weeks ?

## 2022-02-01 ENCOUNTER — Telehealth: Payer: Self-pay | Admitting: *Deleted

## 2022-02-01 ENCOUNTER — Ambulatory Visit (INDEPENDENT_AMBULATORY_CARE_PROVIDER_SITE_OTHER): Payer: Medicare HMO | Admitting: Podiatry

## 2022-02-01 ENCOUNTER — Other Ambulatory Visit: Payer: Self-pay

## 2022-02-01 ENCOUNTER — Ambulatory Visit (INDEPENDENT_AMBULATORY_CARE_PROVIDER_SITE_OTHER): Payer: Medicare HMO

## 2022-02-01 ENCOUNTER — Encounter: Payer: Self-pay | Admitting: Podiatry

## 2022-02-01 DIAGNOSIS — M2012 Hallux valgus (acquired), left foot: Secondary | ICD-10-CM | POA: Diagnosis not present

## 2022-02-01 DIAGNOSIS — M21962 Unspecified acquired deformity of left lower leg: Secondary | ICD-10-CM

## 2022-02-01 DIAGNOSIS — M2042 Other hammer toe(s) (acquired), left foot: Secondary | ICD-10-CM

## 2022-02-01 DIAGNOSIS — Z9889 Other specified postprocedural states: Secondary | ICD-10-CM

## 2022-02-01 NOTE — Telephone Encounter (Signed)
Patient is calling because she is wanting to know if she can stop wearing her boot to bed since pins are out? Please advise. ?

## 2022-02-01 NOTE — Progress Notes (Signed)
She presents today for a postop visit Lapidus procedure second met osteotomy hammertoe second.  Date of surgery 12/22/2021 states that it seems to be sore not too bad. ? ?Objective: Vital signs stable alert oriented times 3 sutures are intact margins well coapted when I have removed the sutures today.  There is no bleeding.  K wire was loose to the second toe removed it.  Radiographs taken today demonstrate well-healing osteotomies and surgical site. ? ?Assessment well-healing surgical foot. ? ?Plan: She will continue nonweightbearing status in her cam boot.  I will follow-up with her in 2 weeks for another set of x-rays at which time we hope to start partial weightbearing. ?

## 2022-02-01 NOTE — Telephone Encounter (Signed)
Patient notified of recommendations, verbalized understanding.

## 2022-02-01 NOTE — Telephone Encounter (Signed)
Can only stop waering to bed but still has to wear it when she is up walking ?

## 2022-02-15 ENCOUNTER — Ambulatory Visit: Payer: Medicare HMO | Admitting: Podiatry

## 2022-02-15 ENCOUNTER — Encounter: Payer: Self-pay | Admitting: Podiatry

## 2022-02-15 ENCOUNTER — Ambulatory Visit (INDEPENDENT_AMBULATORY_CARE_PROVIDER_SITE_OTHER): Payer: Medicare HMO

## 2022-02-15 DIAGNOSIS — M21962 Unspecified acquired deformity of left lower leg: Secondary | ICD-10-CM

## 2022-02-15 DIAGNOSIS — M2012 Hallux valgus (acquired), left foot: Secondary | ICD-10-CM

## 2022-02-15 DIAGNOSIS — M2042 Other hammer toe(s) (acquired), left foot: Secondary | ICD-10-CM

## 2022-02-15 DIAGNOSIS — Z9889 Other specified postprocedural states: Secondary | ICD-10-CM

## 2022-02-15 NOTE — Progress Notes (Signed)
She presents today date of surgery December 22, 2021 left foot Lapidus procedure second and third metatarsal osteotomies with hammertoe repair second left.  She states that she is doing very well still feeling good.  Denies fever chills nausea vomit muscle aches pains calf pain back pain chest pain shortness of breath continues to use her knee scooter. ? ?Objective: Vital signs are stable she is alert and oriented x3.  Pulses are palpable much decrease in edema to the left lower extremity.  Pulses are strong and palpable has good range of motion of the toes.  Has no tenderness on attempted range of motion of the first TMT joint.  No sensitivity to the foot.  Incision sites have gone on to heal uneventfully and did not appear to be problematic. ? ?Radiographs taken today demonstrate osseously mature individual with some osteoarthritic changes of the midfoot though her first TMT joint and internal fixation are intact and it appears that the fusion is consolidating. ? ?Assessment: Well-healing surgical foot. ? ?Plan: At this point we will going to continue with use of the scooter she will start partial weightbearing with a scooter I demonstrated to her with crutches how to perform partial weightbearing with crutches she understands that but states that she is not very good with crutches.  I would like for her to do the partial weightbearing and follow-up with [place] in about 2 weeks 3 at most and then do another set of x-rays to make sure that we have not had any distraction. ?

## 2022-02-20 DIAGNOSIS — Z0189 Encounter for other specified special examinations: Secondary | ICD-10-CM | POA: Diagnosis not present

## 2022-02-20 DIAGNOSIS — E785 Hyperlipidemia, unspecified: Secondary | ICD-10-CM | POA: Diagnosis not present

## 2022-02-20 DIAGNOSIS — I10 Essential (primary) hypertension: Secondary | ICD-10-CM | POA: Diagnosis not present

## 2022-02-20 DIAGNOSIS — J4520 Mild intermittent asthma, uncomplicated: Secondary | ICD-10-CM | POA: Diagnosis not present

## 2022-02-20 DIAGNOSIS — J302 Other seasonal allergic rhinitis: Secondary | ICD-10-CM | POA: Diagnosis not present

## 2022-02-20 DIAGNOSIS — E039 Hypothyroidism, unspecified: Secondary | ICD-10-CM | POA: Diagnosis not present

## 2022-02-20 DIAGNOSIS — Z13820 Encounter for screening for osteoporosis: Secondary | ICD-10-CM | POA: Diagnosis not present

## 2022-02-20 DIAGNOSIS — Z131 Encounter for screening for diabetes mellitus: Secondary | ICD-10-CM | POA: Diagnosis not present

## 2022-02-20 DIAGNOSIS — K219 Gastro-esophageal reflux disease without esophagitis: Secondary | ICD-10-CM | POA: Diagnosis not present

## 2022-02-21 DIAGNOSIS — Z131 Encounter for screening for diabetes mellitus: Secondary | ICD-10-CM | POA: Diagnosis not present

## 2022-02-21 DIAGNOSIS — R7301 Impaired fasting glucose: Secondary | ICD-10-CM | POA: Diagnosis not present

## 2022-02-21 DIAGNOSIS — Z13820 Encounter for screening for osteoporosis: Secondary | ICD-10-CM | POA: Diagnosis not present

## 2022-02-21 DIAGNOSIS — E559 Vitamin D deficiency, unspecified: Secondary | ICD-10-CM | POA: Diagnosis not present

## 2022-02-21 DIAGNOSIS — I10 Essential (primary) hypertension: Secondary | ICD-10-CM | POA: Diagnosis not present

## 2022-02-21 LAB — LAB REPORT - SCANNED
A1c: 5.5
Albumin, Urine POC: 5.3
Albumin/Creatinine Ratio, Urine, POC: 4
Creatinine, POC: 130.9 mg/dL
EGFR: 78

## 2022-02-23 DIAGNOSIS — I10 Essential (primary) hypertension: Secondary | ICD-10-CM | POA: Diagnosis not present

## 2022-02-23 DIAGNOSIS — E039 Hypothyroidism, unspecified: Secondary | ICD-10-CM | POA: Diagnosis not present

## 2022-02-23 DIAGNOSIS — Z791 Long term (current) use of non-steroidal anti-inflammatories (NSAID): Secondary | ICD-10-CM | POA: Diagnosis not present

## 2022-02-23 DIAGNOSIS — J45909 Unspecified asthma, uncomplicated: Secondary | ICD-10-CM | POA: Diagnosis not present

## 2022-02-23 DIAGNOSIS — Z91040 Latex allergy status: Secondary | ICD-10-CM | POA: Diagnosis not present

## 2022-02-23 DIAGNOSIS — K219 Gastro-esophageal reflux disease without esophagitis: Secondary | ICD-10-CM | POA: Diagnosis not present

## 2022-02-23 DIAGNOSIS — E785 Hyperlipidemia, unspecified: Secondary | ICD-10-CM | POA: Diagnosis not present

## 2022-02-23 DIAGNOSIS — Z8249 Family history of ischemic heart disease and other diseases of the circulatory system: Secondary | ICD-10-CM | POA: Diagnosis not present

## 2022-02-23 DIAGNOSIS — Z6838 Body mass index (BMI) 38.0-38.9, adult: Secondary | ICD-10-CM | POA: Diagnosis not present

## 2022-02-23 DIAGNOSIS — M1990 Unspecified osteoarthritis, unspecified site: Secondary | ICD-10-CM | POA: Diagnosis not present

## 2022-02-27 DIAGNOSIS — E039 Hypothyroidism, unspecified: Secondary | ICD-10-CM | POA: Diagnosis not present

## 2022-02-27 DIAGNOSIS — E785 Hyperlipidemia, unspecified: Secondary | ICD-10-CM | POA: Diagnosis not present

## 2022-02-27 DIAGNOSIS — I10 Essential (primary) hypertension: Secondary | ICD-10-CM | POA: Diagnosis not present

## 2022-02-27 DIAGNOSIS — K219 Gastro-esophageal reflux disease without esophagitis: Secondary | ICD-10-CM | POA: Diagnosis not present

## 2022-02-27 DIAGNOSIS — J302 Other seasonal allergic rhinitis: Secondary | ICD-10-CM | POA: Diagnosis not present

## 2022-02-27 DIAGNOSIS — Z131 Encounter for screening for diabetes mellitus: Secondary | ICD-10-CM | POA: Diagnosis not present

## 2022-02-27 DIAGNOSIS — J4520 Mild intermittent asthma, uncomplicated: Secondary | ICD-10-CM | POA: Diagnosis not present

## 2022-03-01 ENCOUNTER — Encounter: Payer: Medicare HMO | Admitting: Podiatry

## 2022-03-22 ENCOUNTER — Ambulatory Visit (INDEPENDENT_AMBULATORY_CARE_PROVIDER_SITE_OTHER): Payer: Medicare HMO

## 2022-03-22 ENCOUNTER — Encounter: Payer: Self-pay | Admitting: Podiatry

## 2022-03-22 ENCOUNTER — Ambulatory Visit (INDEPENDENT_AMBULATORY_CARE_PROVIDER_SITE_OTHER): Payer: Medicare HMO | Admitting: Podiatry

## 2022-03-22 DIAGNOSIS — M21962 Unspecified acquired deformity of left lower leg: Secondary | ICD-10-CM | POA: Diagnosis not present

## 2022-03-22 DIAGNOSIS — Z9889 Other specified postprocedural states: Secondary | ICD-10-CM

## 2022-03-22 DIAGNOSIS — M2042 Other hammer toe(s) (acquired), left foot: Secondary | ICD-10-CM

## 2022-03-22 DIAGNOSIS — M2012 Hallux valgus (acquired), left foot: Secondary | ICD-10-CM

## 2022-03-25 NOTE — Progress Notes (Signed)
She presents today date of surgery 12/22/2021 left foot Lapidus procedure and second and third metatarsal osteotomies and second hammertoe repair.  The swelling is what seems to bother me the motion is seems to be doing better. ? ?Objective: Vital signs are stable alert and oriented x3.  Moderate edema to the left lower extremity she has good range of motion of the first metatarsophalangeal joint with good reduction of her angular deformity.  She has minimal tenderness on palpation and range of motion or attempted range of motion at the first metatarsal medial cuneiform joint.  Radiographs demonstrate well-healing osteotomy and arthrodesis site.  Internal fixation is intact.  She retains a moderate amount of edema to the left foot but does demonstrate edema to the bilateral legs and feet.  Left is obviously worse due to surgery. ? ?Assessment: Well-healing surgical foot.  Edema bilateral left is worse than the right. ? ?Plan: Discussed etiology pathology conservative versus surgical therapies.  At this point since things seem to be healing as well as they are and seem to be on target and she does have edema bilaterally I am recommended that she follow-up with Dr. Wende Neighbors in [place] hopefully he will be able to help offload some of the fluid that she is retaining.  I do think that this will help considerably.  I will follow-up with her in about 6 weeks another set of x-rays will be done and hopefully that will be our last. ? ? ?

## 2022-03-26 IMAGING — CT CT FOOT*L* W/O CM
3 series · 11 of 33 positions shown, 13 images · non-contrast
Comparison: Left foot radiograph 05/18/2021

CLINICAL DATA: Foot pain, chronic, osteoarthritis suspected
Evaluate severe osteoarthritis, history of trauma left - surgical
consideration - last xray done today 05-18-21

EXAM:
CT OF THE LEFT FOOT WITHOUT CONTRAST
TECHNIQUE: Multidetector CT imaging of the left foot was performed according to
the standard protocol. Multiplanar CT image reconstructions were
also generated.

[Series 4: soft tissue lower extremity · axial · 0.55mm/px · z∈[-134,-42]mm · 3 of 76 slices shown, 4 images]
[im 18/76  soft-tissue]
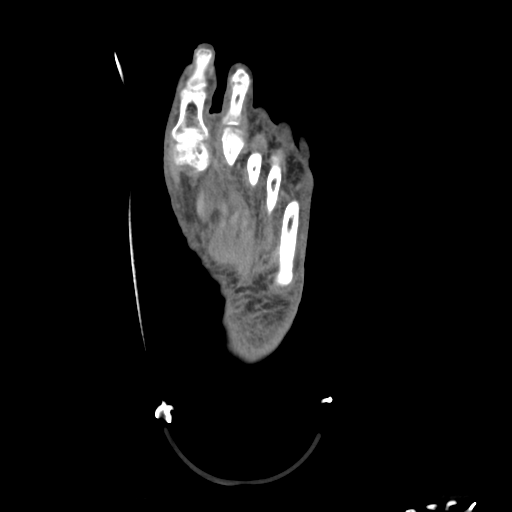
[im 18/76  bone]
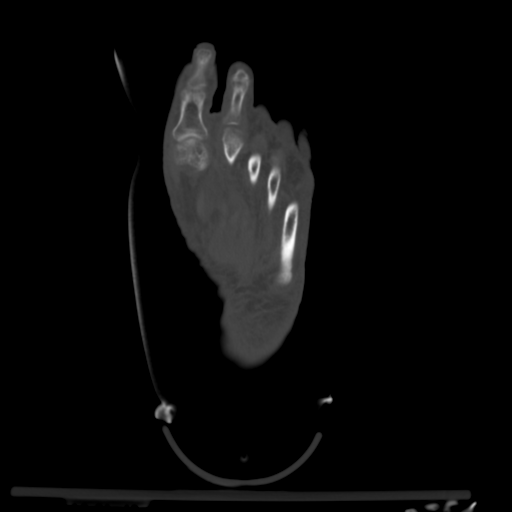
[im 41/76  bone]
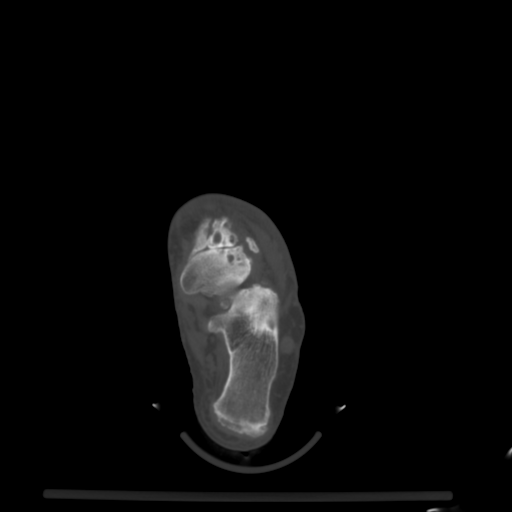
[im 64/76  bone]
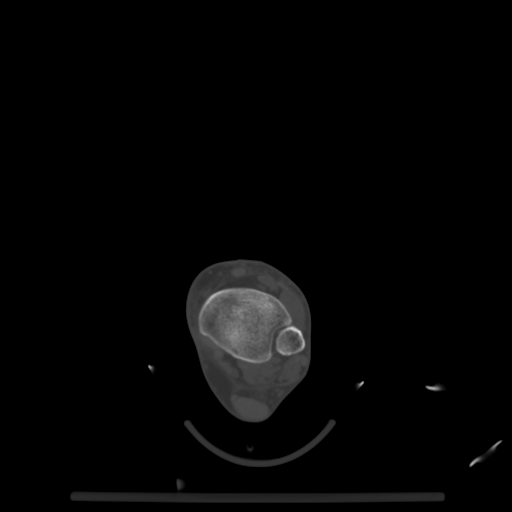

[Series 9: cor soft tissue · coronal · 0.21mm/px · 3 of 120 slices shown]
[im 56/120  bone]
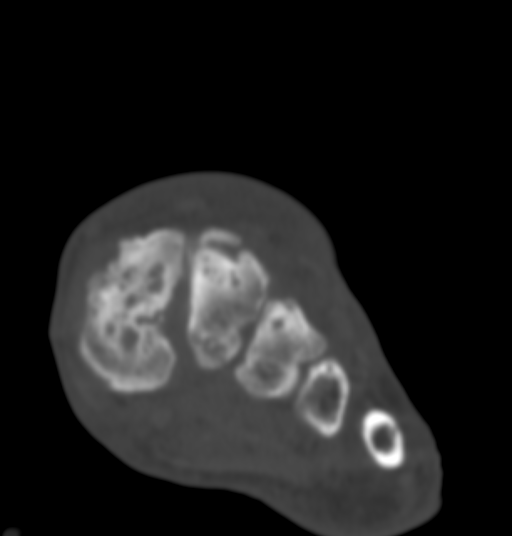
[im 72/120  bone]
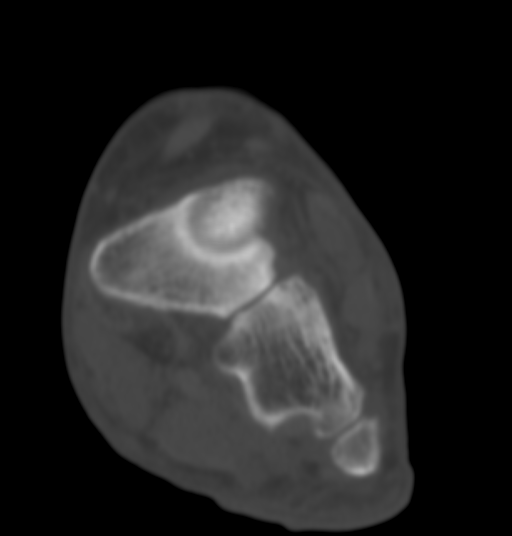
[im 87/120  bone]
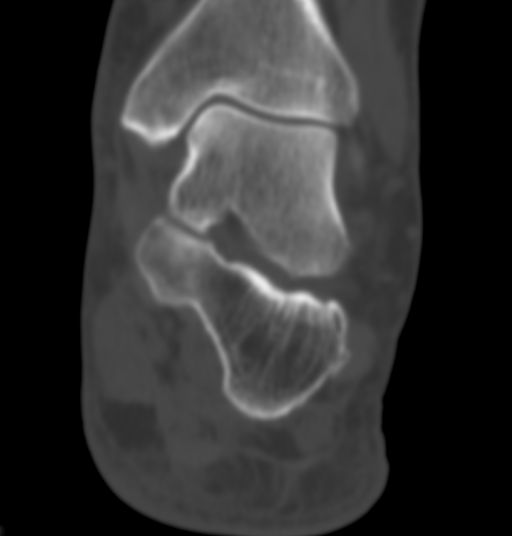

[Series 10: sagsoft tissue · sagittal · 0.22mm/px · 5 of 50 slices shown, 6 images]
[im 17/50  bone]
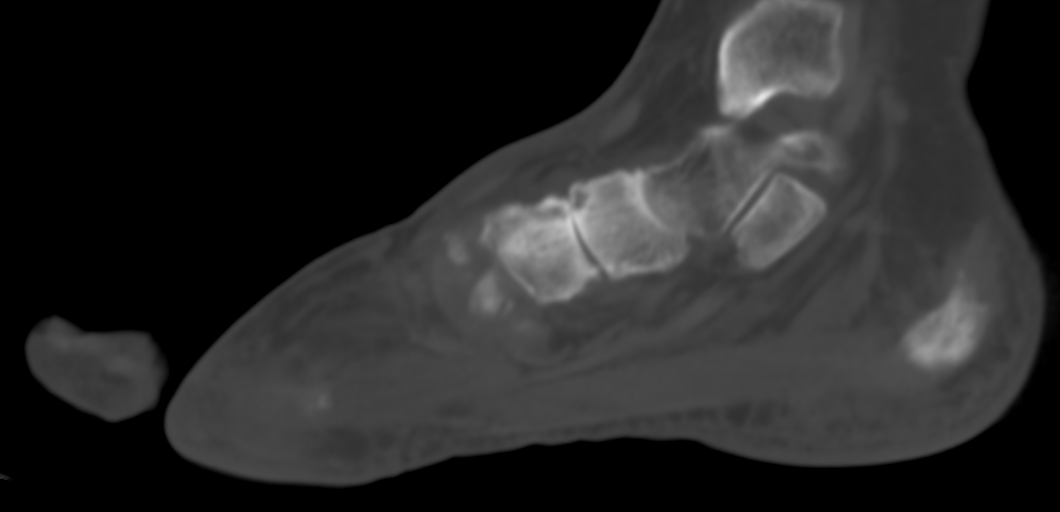
[im 21/50  bone]
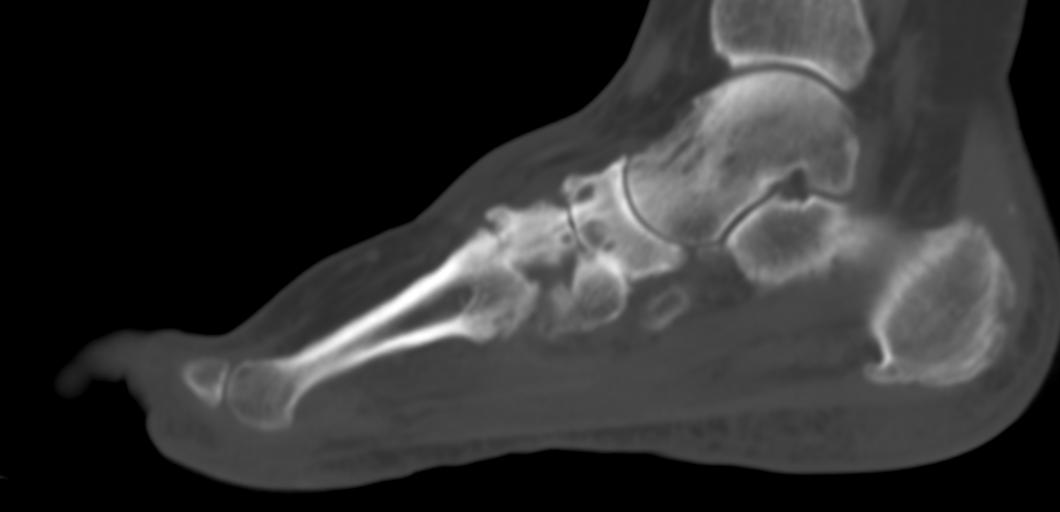
[im 25/50  soft-tissue]
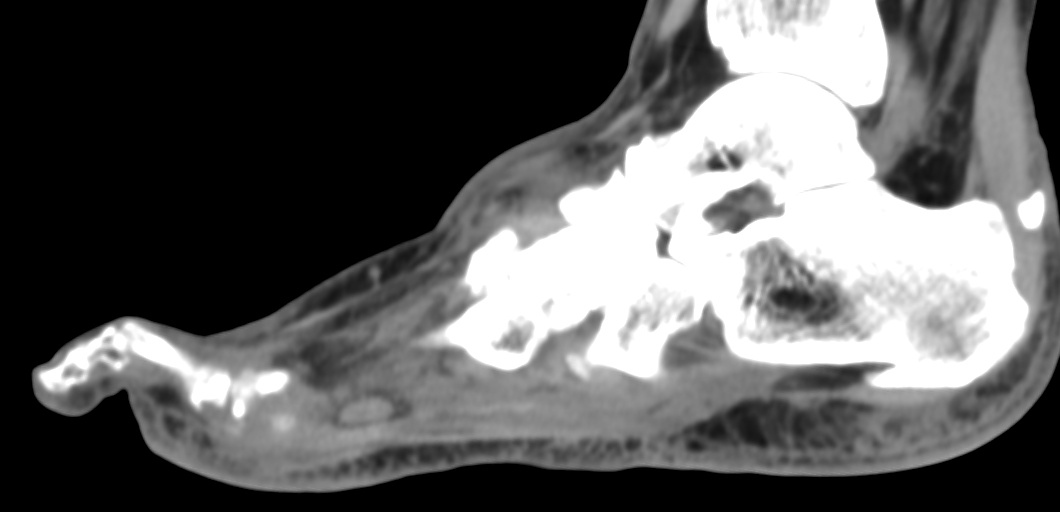
[im 25/50  bone]
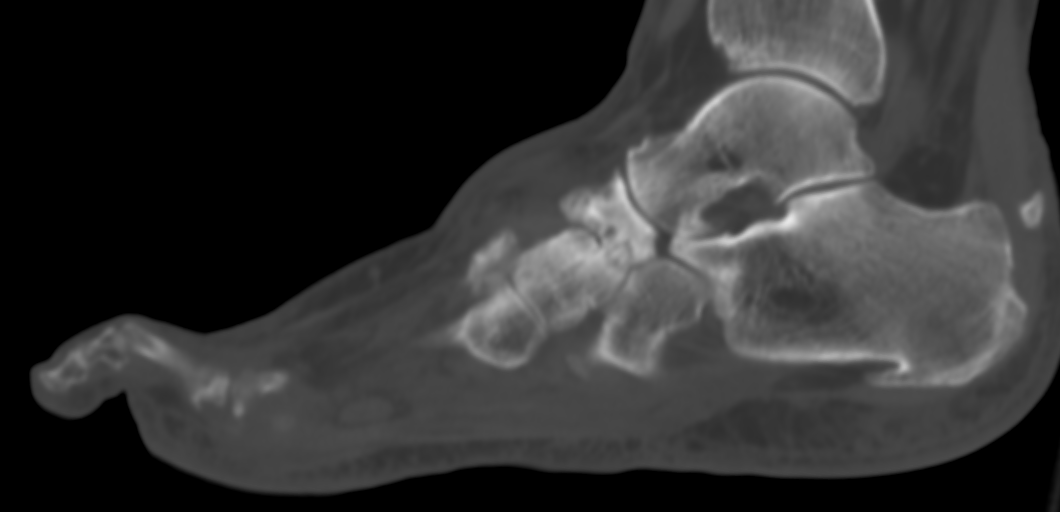
[im 29/50  bone]
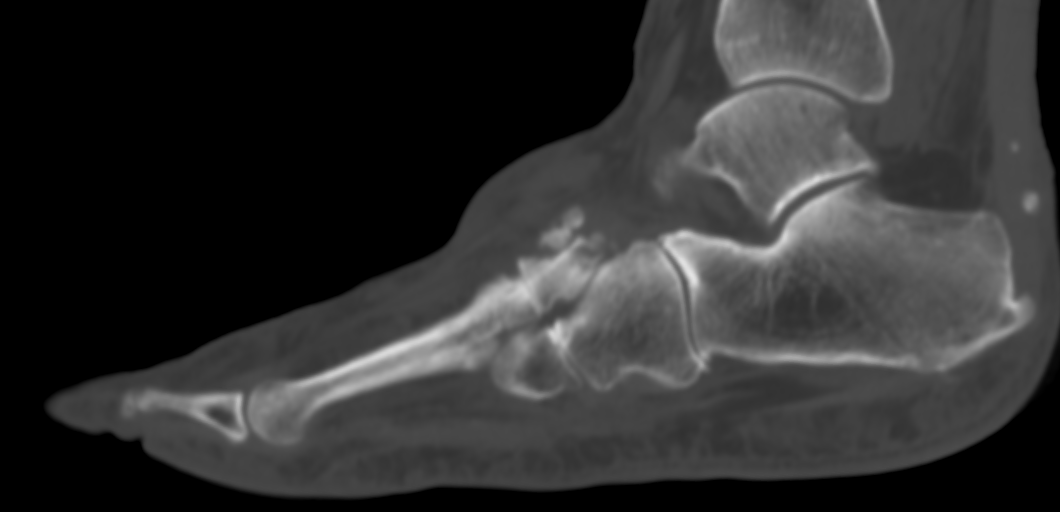
[im 33/50  bone]
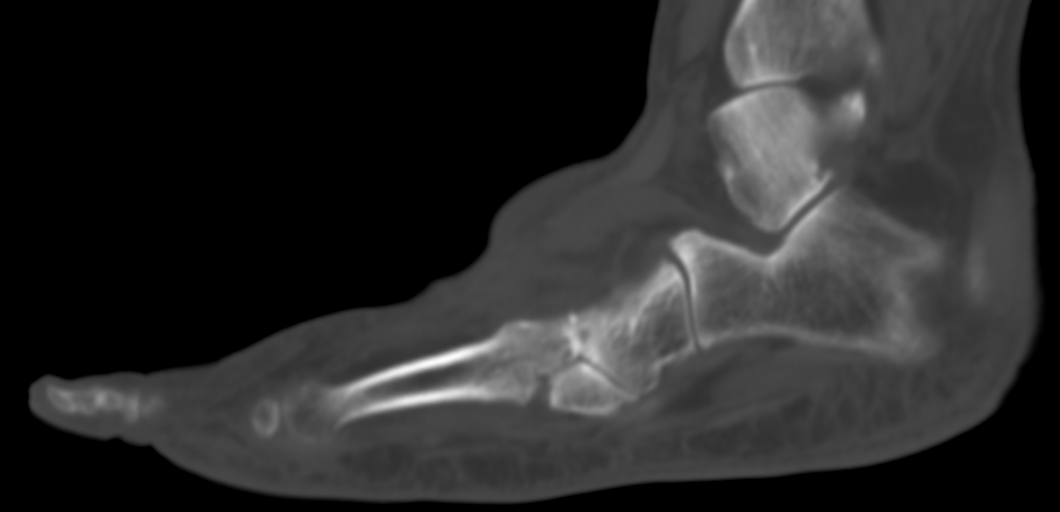

[11 of 33 positions shown; findings below may reference images not displayed]

FINDINGS: Bones/Joint/Cartilage

There is a nondisplaced fracture along the plantar aspect of the
base of the first metatarsal. There is mild widening of the Lisfranc
interval.

There is a mildly displaced fracture of the dorsal lateral aspect of
the lateral cuneiform.

There is severe osteoarthritis involving the first through fourth
tarsometatarsal joints. There is severe osteoarthritis involving the
navicular-middle and lateral cuneiform joints. There is
calcification along the collateral ligaments of the second
metatarsophalangeal joint.

There is mild posterior subtalar degenerative change. Dorsal and
plantar calcaneal spurring.

Ligaments

Suboptimally assessed by CT.

Muscles and Tendons

There is thickening of the distal Achilles tendon with
intratendinous calcification/ossification and enthesophyte formation

Soft tissues

There is generalized soft tissue swelling of the foot.
IMPRESSION: Nondisplaced fracture along the plantar aspect of the base of the
first metatarsal extending to the tarsometatarsal joint.

Mild widening of the Lisfranc interval concerning for Lisfranc
injury.

Mildly displaced fracture of the dorsal lateral aspect of the
lateral cuneiform.

Severe osteoarthritis of the first through fourth tarsometatarsal
joints.

Severe osteoarthritis of the navicular-middle and lateral cuneiform
joints.

Findings of chronic insertional Achilles tendinopathy.

## 2022-05-08 ENCOUNTER — Encounter: Payer: Medicare HMO | Admitting: Podiatry

## 2022-06-12 ENCOUNTER — Ambulatory Visit (INDEPENDENT_AMBULATORY_CARE_PROVIDER_SITE_OTHER): Payer: Medicare HMO | Admitting: Podiatry

## 2022-06-12 ENCOUNTER — Encounter: Payer: Self-pay | Admitting: Podiatry

## 2022-06-12 ENCOUNTER — Ambulatory Visit (INDEPENDENT_AMBULATORY_CARE_PROVIDER_SITE_OTHER): Payer: Medicare HMO

## 2022-06-12 DIAGNOSIS — M2012 Hallux valgus (acquired), left foot: Secondary | ICD-10-CM

## 2022-06-12 DIAGNOSIS — Z9889 Other specified postprocedural states: Secondary | ICD-10-CM

## 2022-06-12 DIAGNOSIS — M2042 Other hammer toe(s) (acquired), left foot: Secondary | ICD-10-CM

## 2022-06-12 DIAGNOSIS — M21962 Unspecified acquired deformity of left lower leg: Secondary | ICD-10-CM

## 2022-06-12 NOTE — Progress Notes (Signed)
She presents today date of surgery 12/22/2021 left foot Lapidus procedure second and possible third metatarsal osteotomies with hammertoe repair.  States that aches some every day and swells all the time but most part is much better than it was previously.  Objective: Vital signs are stable alert and oriented x3.  Pulses are palpable.  She has no pain on range of motion of the first metatarsal phalangeal joint and she does have swelling around the base of the first metatarsal medial cuneiform arthrodesis site.  Radiographs taken today confirm well-healed arthrodesis site as well as some osteotomies to the heads of #2 and #3 with screw fixation.  Assessment: Well-healing surgical foot moderate edema.  Plan: Encouraged her to wear compression hose to help keep the edema down.  Encouraged her to continue massage therapy and range of motion of the toes particularly toe crunches she understands and is amenable to we will follow-up with me on an as-needed basis.

## 2022-08-22 DIAGNOSIS — I10 Essential (primary) hypertension: Secondary | ICD-10-CM | POA: Diagnosis not present

## 2022-08-22 DIAGNOSIS — Z131 Encounter for screening for diabetes mellitus: Secondary | ICD-10-CM | POA: Diagnosis not present

## 2022-08-22 DIAGNOSIS — E039 Hypothyroidism, unspecified: Secondary | ICD-10-CM | POA: Diagnosis not present

## 2022-08-23 LAB — LAB REPORT - SCANNED
A1c: 5.7
Albumin, Urine POC: <3
Albumin/Creatinine Ratio, Urine, POC: <3
Creatinine, POC: 97.6 mg/dL
EGFR: 67

## 2022-08-29 DIAGNOSIS — E785 Hyperlipidemia, unspecified: Secondary | ICD-10-CM | POA: Diagnosis not present

## 2022-08-29 DIAGNOSIS — I10 Essential (primary) hypertension: Secondary | ICD-10-CM | POA: Diagnosis not present

## 2022-08-29 DIAGNOSIS — K219 Gastro-esophageal reflux disease without esophagitis: Secondary | ICD-10-CM | POA: Diagnosis not present

## 2022-08-29 DIAGNOSIS — R7303 Prediabetes: Secondary | ICD-10-CM | POA: Diagnosis not present

## 2022-08-29 DIAGNOSIS — J302 Other seasonal allergic rhinitis: Secondary | ICD-10-CM | POA: Diagnosis not present

## 2022-08-29 DIAGNOSIS — J4520 Mild intermittent asthma, uncomplicated: Secondary | ICD-10-CM | POA: Diagnosis not present

## 2022-08-29 DIAGNOSIS — E039 Hypothyroidism, unspecified: Secondary | ICD-10-CM | POA: Diagnosis not present

## 2022-12-06 ENCOUNTER — Ambulatory Visit (INDEPENDENT_AMBULATORY_CARE_PROVIDER_SITE_OTHER): Payer: Medicare HMO

## 2022-12-06 ENCOUNTER — Ambulatory Visit: Payer: Medicare HMO | Admitting: Podiatry

## 2022-12-06 ENCOUNTER — Encounter: Payer: Self-pay | Admitting: Podiatry

## 2022-12-06 DIAGNOSIS — L02612 Cutaneous abscess of left foot: Secondary | ICD-10-CM

## 2022-12-06 DIAGNOSIS — L97521 Non-pressure chronic ulcer of other part of left foot limited to breakdown of skin: Secondary | ICD-10-CM

## 2022-12-06 MED ORDER — CLINDAMYCIN HCL 150 MG PO CAPS: 150.0000 mg | ORAL_CAPSULE | Freq: Three times a day (TID) | ORAL | 1 refills | Status: DC

## 2022-12-06 MED ORDER — CIPROFLOXACIN HCL 500 MG PO TABS: 500.0000 mg | ORAL_TABLET | Freq: Two times a day (BID) | ORAL | 0 refills | Status: AC

## 2022-12-06 NOTE — Progress Notes (Signed)
Subjective:  Patient ID: Sheila Kerr, female    DOB: [birth year],  MRN: [id number] HPI No chief complaint on file.   67 y.o. female presents with the above complaint.   ROS: She denies fever chills nausea vomit muscle aches and pains.  No past medical history on file.   Current Outpatient Medications:    albuterol (VENTOLIN HFA) 108 (90 Base) MCG/ACT inhaler, , Disp: , Rfl:    diclofenac (VOLTAREN) 50 MG EC tablet, , Disp: , Rfl:    ezetimibe (ZETIA) 10 MG tablet, , Disp: , Rfl:    fexofenadine-pseudoephedrine (ALLEGRA-D 24) 180-240 MG 24 hr tablet, Take 1 tablet by mouth daily., Disp: , Rfl:    hydrochlorothiazide (HYDRODIURIL) 12.5 MG tablet, , Disp: , Rfl:    influenza vac split quadrivalent PF (FLUARIX QUADRIVALENT) 0.5 ML injection, TO BE ADMINISTERED BY PHARMACIST FOR IMMUNIZATION, Disp: , Rfl:    levocetirizine (XYZAL) 5 MG tablet, Take 5 mg by mouth daily., Disp: , Rfl:    levothyroxine (SYNTHROID) 100 MCG tablet, , Disp: , Rfl:    lisinopril (ZESTRIL) 20 MG tablet, , Disp: , Rfl:    mupirocin ointment (BACTROBAN) 2 %, Apply to wound after soaking BID, Disp: 30 g, Rfl: 1   omeprazole (PRILOSEC) 40 MG capsule, SMARTSIG:1 Capsule(s) By Mouth Every Evening, Disp: , Rfl:    ondansetron (ZOFRAN) 4 MG tablet, Take 1 tablet (4 mg total) by mouth every 8 (eight) hours as needed., Disp: 20 tablet, Rfl: 0   Selenium (SELENOMAX PO), Take by mouth., Disp: , Rfl:    silver sulfADIAZINE (SILVADENE) 1 % cream, Apply 1 application topically daily., Disp: , Rfl:    Vitamin D, Ergocalciferol, (DRISDOL) 1.25 MG (50000 UNIT) CAPS capsule, Take 1 capsule (50,000 Units total) by mouth every 7 (seven) days., Disp: 5 capsule, Rfl: 0  Allergies  Allergen Reactions   Amoxicillin-Pot Clavulanate Nausea And Vomiting   Adhesive [Tape]    Review of Systems Objective:  There were no vitals filed for this visit.  General: Well developed, nourished, in no acute distress, alert and oriented x3    Dermatological: Skin is warm, dry and supple bilateral. Nails x 10 are well maintained; remaining integument appears unremarkable at this time. There are no open sores, no preulcerative lesions, no rash or signs of infection present.  Open wound to the plantar first metatarsophalangeal joint with undermining of purulence toward the hallux.  All of the skin was sharply resected today did not probe deep.  Culture and sensitivity were taken.  All necrotic tissue was sharply resected.  Vascular: Dorsalis Pedis artery and Posterior Tibial artery pedal pulses are 2/4 bilateral with immedate capillary fill time. Pedal hair growth present. No varicosities and no lower extremity edema present bilateral.   Neruologic: Grossly intact via light touch bilateral. Vibratory intact via tuning fork bilateral. Protective threshold with Semmes Wienstein monofilament intact to all pedal sites bilateral. Patellar and Achilles deep tendon reflexes 2+ bilateral. No Babinski or clonus noted bilateral.   Musculoskeletal: No gross boney pedal deformities bilateral. No pain, crepitus, or limitation noted with foot and ankle range of motion bilateral. Muscular strength 5/5 in all groups tested bilateral.  Gait: Unassisted, Nonantalgic.    Radiographs:  Radiographs taken today demonstrate a well-healed Lapidus procedure hallux appears to be extended slightly though she does not demonstrate any type of gas in the tissues or bony breakdown.  Assessment & Plan:   Assessment: Subfirst metatarsophalangeal joint abscess possibly secondary to extensive is.  Plan: Debridement  of all necrotic tissue today placed order dressed a compressive dressing and a Darco shoe she will start cleaning this daily and placing Betadine solution on it daily I did take a culture and sensitivity we will send that but out for results put her on clindamycin and Cipro.  I will follow-up with her in 1 week watch for signs and symptoms of infection if  any should occur she is to go to the emergency department.     Tifanie Gardiner T. [place], [place]

## 2022-12-09 LAB — WOUND CULTURE
MICRO NUMBER:: 14473212
SPECIMEN QUALITY:: ADEQUATE

## 2022-12-13 ENCOUNTER — Ambulatory Visit: Payer: Medicare HMO | Admitting: Podiatry

## 2022-12-13 ENCOUNTER — Encounter: Payer: Self-pay | Admitting: Podiatry

## 2022-12-13 DIAGNOSIS — L97521 Non-pressure chronic ulcer of other part of left foot limited to breakdown of skin: Secondary | ICD-10-CM

## 2022-12-15 NOTE — Progress Notes (Signed)
She presents today for follow-up of her ulceration subfirst metatarsal phalangeal joint of her left foot.  She states that is looking and feeling much better.  Denies fever chills nausea vomit muscle aches and pains states that she is completing her medication.  Objective: Vital signs are stable alert and oriented x 3 large ulceration to the plantar aspect of the first metatarsal phalangeal joint extending to the first interdigital space last visit has nearly 100% healed by this time.  She continues to place her Betadine solution and dressed this foot daily she continues to wear her Darco shoe.  Assessment: Well-healing ulcerative lesion subfirst metatarsophalangeal joint left foot.  Cannot rule out that this developed due to a extension problem of the hallux.  Plan: Encouraged her to continue all therapies at this point I would like to follow-up with her in about 2 weeks.

## 2022-12-20 DIAGNOSIS — Z1231 Encounter for screening mammogram for malignant neoplasm of breast: Secondary | ICD-10-CM | POA: Diagnosis not present

## 2022-12-20 DIAGNOSIS — R92323 Mammographic fibroglandular density, bilateral breasts: Secondary | ICD-10-CM | POA: Diagnosis not present

## 2022-12-25 ENCOUNTER — Ambulatory Visit: Payer: Medicare HMO | Admitting: Podiatry

## 2022-12-25 ENCOUNTER — Encounter: Payer: Self-pay | Admitting: Podiatry

## 2022-12-25 DIAGNOSIS — L97521 Non-pressure chronic ulcer of other part of left foot limited to breakdown of skin: Secondary | ICD-10-CM

## 2022-12-25 NOTE — Progress Notes (Signed)
She presents today for follow-up of her ulceration left.  States that is still oozing a little bit and the callus is starting to peel away.  She goes on to say that she is no longer utilizing her surgical shoe.  Objective: Vital signs are stable she is alert and oriented x 3.  Pulses are palpable.  There is no erythema edema cellulitis drainage or odor she does have a superficial ulcerative lesion measuring 0.2 cm in width by 0.5 cm in total length beneath the first metatarsal phalangeal joint left foot.  There is no signs of infection.  Assessment: Well-healing ulcerative lesion.  Plan: Discussed offloading with her today debrided the wound for her thoroughly we discussed that this could continue to take a while before it heals currently is not infected so we do not need antibiotics.

## 2023-01-22 ENCOUNTER — Encounter: Payer: Self-pay | Admitting: Podiatry

## 2023-01-22 ENCOUNTER — Ambulatory Visit: Payer: Medicare HMO | Admitting: Podiatry

## 2023-01-22 DIAGNOSIS — L97521 Non-pressure chronic ulcer of other part of left foot limited to breakdown of skin: Secondary | ICD-10-CM

## 2023-01-22 NOTE — Progress Notes (Signed)
She presents today for follow-up of the ulcerative lesion subfirst left due to her Lapidus procedure and her extension at the first metatarsophalangeal joint this is resulting in a plantarflexed first metatarsal and a chronic ulceration.  Objective: Vital signs stable alert and oriented x 3 the ulcerative lesion appears to be healing very nicely is much more superficial.  Reactive hyperkeratosis surrounding it once debrided today does not demonstrate any type of bacterial infection we do have bleeding that is present.  Assessment: Well-healing ulcerative lesion.  Plan: At this point I am starting her on Iodosorb I recommended her knee scooter to keep the pressure off and also recommended the Darco shoe with cut out.  I will follow-up with her in about 1 month.

## 2023-02-21 ENCOUNTER — Ambulatory Visit: Payer: Medicare HMO | Admitting: Podiatry

## 2023-02-21 ENCOUNTER — Encounter: Payer: Self-pay | Admitting: Podiatry

## 2023-02-21 DIAGNOSIS — L97521 Non-pressure chronic ulcer of other part of left foot limited to breakdown of skin: Secondary | ICD-10-CM

## 2023-02-21 DIAGNOSIS — M86672 Other chronic osteomyelitis, left ankle and foot: Secondary | ICD-10-CM

## 2023-02-21 NOTE — Progress Notes (Signed)
She presents today with her husband for follow-up of the ulceration subfirst metatarsal phalangeal joint of the left foot.  She denies fever chills nausea vomit muscle aches and pains.  States that is just not getting any better.  Objective: Vital signs are stable she is alert and oriented x 3.  Pulses are palpable.  Ulcerative lesion to the plantar aspect of the first metatarsal phalangeal joint demonstrates no deep tracking but cannot be sure that the sesamoids are not involved.  Plantarflexed first metatarsal status post Lapidus procedure with dorsiflexion at the first metatarsal phalangeal joint most likely resulting in excessive pressure plantar first metatarsal.  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  Plan: Discussed etiology pathology conservative surgical therapies at this point we need an MRI to rule out osteomyelitic process of that sesamoidal apparatus or of the first metatarsal phalangeal joint.  Will consider surgical intervention once just MRI results come in.  May need to also consider CBC CMP sed rate CRP.  We will start redressing the wound with iodine bandages on a regular basis until the MRI is performed to the forefoot left.

## 2023-02-22 DIAGNOSIS — I10 Essential (primary) hypertension: Secondary | ICD-10-CM | POA: Diagnosis not present

## 2023-02-22 DIAGNOSIS — E039 Hypothyroidism, unspecified: Secondary | ICD-10-CM | POA: Diagnosis not present

## 2023-02-25 LAB — LAB REPORT - SCANNED
Albumin, Urine POC: 3
Creatinine, POC: 66.7 mg/dL
EGFR: 64
Microalb Creat Ratio: 4

## 2023-02-26 ENCOUNTER — Telehealth: Payer: Self-pay

## 2023-02-27 ENCOUNTER — Telehealth: Payer: Self-pay | Admitting: Podiatry

## 2023-02-27 DIAGNOSIS — Z0000 Encounter for general adult medical examination without abnormal findings: Secondary | ICD-10-CM | POA: Diagnosis not present

## 2023-02-27 NOTE — Telephone Encounter (Signed)
Order was order was printed and faxed over today.

## 2023-02-27 NOTE — Telephone Encounter (Signed)
[facility] called stating that this patient needs a prior auth prior to their appointment on 03/05/2023. Will need auth on 03/04/2023 by 1;00pm   Phone Number: [phone]  Tax ID [id number]

## 2023-02-28 ENCOUNTER — Telehealth: Payer: Self-pay | Admitting: Podiatry

## 2023-02-28 DIAGNOSIS — J4520 Mild intermittent asthma, uncomplicated: Secondary | ICD-10-CM | POA: Diagnosis not present

## 2023-02-28 DIAGNOSIS — R7303 Prediabetes: Secondary | ICD-10-CM | POA: Diagnosis not present

## 2023-02-28 DIAGNOSIS — L304 Erythema intertrigo: Secondary | ICD-10-CM | POA: Diagnosis not present

## 2023-02-28 DIAGNOSIS — I10 Essential (primary) hypertension: Secondary | ICD-10-CM | POA: Diagnosis not present

## 2023-02-28 DIAGNOSIS — K219 Gastro-esophageal reflux disease without esophagitis: Secondary | ICD-10-CM | POA: Diagnosis not present

## 2023-02-28 DIAGNOSIS — E785 Hyperlipidemia, unspecified: Secondary | ICD-10-CM | POA: Diagnosis not present

## 2023-02-28 DIAGNOSIS — J302 Other seasonal allergic rhinitis: Secondary | ICD-10-CM | POA: Diagnosis not present

## 2023-02-28 DIAGNOSIS — E039 Hypothyroidism, unspecified: Secondary | ICD-10-CM | POA: Diagnosis not present

## 2023-02-28 NOTE — Telephone Encounter (Signed)
Sheila Kerr with [facility] states pt is scheduled to come in for MRI w/o contrast for ulcer of Left foot.She wants to know if this should be updated to with contrast to check for infection?  Please advise.  Phone - [phone]  Send in new order with change if needed.Marland KitchenMarland Kitchen

## 2023-03-05 DIAGNOSIS — L97521 Non-pressure chronic ulcer of other part of left foot limited to breakdown of skin: Secondary | ICD-10-CM | POA: Diagnosis not present

## 2023-03-05 DIAGNOSIS — M86672 Other chronic osteomyelitis, left ankle and foot: Secondary | ICD-10-CM | POA: Diagnosis not present

## 2023-03-13 ENCOUNTER — Telehealth: Payer: Self-pay | Admitting: *Deleted

## 2023-03-13 NOTE — Telephone Encounter (Signed)
Spoke with patient giving results per physician, no infection, has an upcoming appointment on 05/14 to discuss further. She verbalized understanding.

## 2023-03-26 ENCOUNTER — Ambulatory Visit: Payer: Medicare HMO | Admitting: Podiatry

## 2023-03-26 ENCOUNTER — Encounter: Payer: Self-pay | Admitting: Podiatry

## 2023-03-26 DIAGNOSIS — L97521 Non-pressure chronic ulcer of other part of left foot limited to breakdown of skin: Secondary | ICD-10-CM | POA: Diagnosis not present

## 2023-03-26 NOTE — Progress Notes (Signed)
She presents today with her husband for follow-up of her MRI of her left foot.  She states that the ulceration seems to be getting a little better since she went [place] and there help offload the area in her shoe.  Objective: Vital signs are stable alert and oriented x 3.  Pulses are palpable.  The wound measures 0.9 cm at its widest girth and is very superficial currently it is surrounded by an area of macerated tissue but no purulence no malodor.  MRI demonstrates no deep infections nor does it demonstrate any osteomyelitis.  Assessment: Slowly healing ulcerative lesion subfirst metatarsal head of the left foot.  Plan: Continue conservative therapies we discussed in great detail today surgical intervention which she does not want to do at this time.  I did recommend offloading with a knee scooter.  They are leaving for [place] and she states that she will be on her feet less in [place] she is leaving Thursday and will be gone all next week.  I like to see her back in about 3 weeks.  I debrided the wound today and dressed it with a Betadine dressing.

## 2023-04-16 ENCOUNTER — Ambulatory Visit: Payer: Medicare HMO | Admitting: Podiatry

## 2023-04-16 ENCOUNTER — Encounter: Payer: Self-pay | Admitting: Podiatry

## 2023-04-16 DIAGNOSIS — L97521 Non-pressure chronic ulcer of other part of left foot limited to breakdown of skin: Secondary | ICD-10-CM

## 2023-04-16 NOTE — Progress Notes (Signed)
She presents today for follow-up of her ulceration subfirst metatarsal phalangeal joint of her left foot.  States that I think it may be getting worse I am so frustrated that is not getting any better.  When I question her as to whether or not she has been offloading by not walking on the foot she stated that she has not been nonweightbearing.  That she thinks she may have to get her knee scooter out.  Objective: Vital signs are stable alert oriented x 3 ulcerative lesion subfirst metatarsal phalangeal joint of the left foot does not demonstrate any purulence she has macerated tissue surrounding a 1 cm ulcerative lesion with granulomatous tissue and some epithelization.  Most likely this is due to a plantarflexed first metatarsal status post Lapidus procedure with extension at the level of the first metatarsophalangeal joint.  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  Plan: Discussed etiology pathology conservative surgical therapies debrided the wound today to bleeding.  We are going to start covering it and applying a Betadine dressing.  I once again stressed the importance of nonweightbearing status.  Unguinal refer her to Dr. Lilian Kapur for evaluation as to whether or not he thinks that he can get this close any other way or possibly a fusion of the first metatarsal phalangeal joint.  He and I have discussed this patient on numerous occasions.

## 2023-04-25 ENCOUNTER — Ambulatory Visit: Payer: Medicare HMO | Admitting: Podiatry

## 2023-04-25 DIAGNOSIS — M21962 Unspecified acquired deformity of left lower leg: Secondary | ICD-10-CM | POA: Diagnosis not present

## 2023-04-25 DIAGNOSIS — M24572 Contracture, left ankle: Secondary | ICD-10-CM | POA: Diagnosis not present

## 2023-04-25 DIAGNOSIS — L97522 Non-pressure chronic ulcer of other part of left foot with fat layer exposed: Secondary | ICD-10-CM

## 2023-04-28 ENCOUNTER — Encounter: Payer: Self-pay | Admitting: Podiatry

## 2023-04-28 NOTE — Progress Notes (Signed)
Subjective:  Patient ID: Sheila Kerr, female    DOB: 1956-03-18,  MRN: [id number]  Chief Complaint  Patient presents with   Foot Ulcer    Patient came in today for left foot ulcer, forefoot, little drainage, patient denies any pain,     67 y.o. female presents with the above complaint. History confirmed with patient.  She presents today for follow-up and for a second opinion, her husband is present here as well.  She previously has had first TMT fusion, was doing well until earlier this year in January and February when she developed a blister that turned fully into an ulceration.  She has been undergoing local wound care and debridement in the office here with Dr. Al Corpus.  Had lab work and MRI completed in April which did not show signs of infection.  So far feels like she has not had much success although she says it is not as deep as it used to be.  She has a surgical shoe with a peg assist device, today she ambulates in regular sandals.  Has been dressing it with gauze and tape bandaging.  She also notes that the hallux sits up in the air and does not purchase the ground well Objective:  Physical Exam: warm, good capillary refill, no trophic changes or ulcerative lesions, normal DP and PT pulses, and she does appear to be somewhat neuropathic on the plantar forefoot on the left foot, ulceration measuring 0.7 x 0.7 x 0.7 cm submetatarsal 1 on the left foot.  Exposed granular wound bed.  Surrounding a large hyperkeratosis.  No signs of infection.    MRI 03/05/2023 showed no signs of osteomyelitis  Lab work completed 02/22/2023 showed sed rate of 17 and CRP of 3  Radiographs: Multiple views x-ray of the left foot:  Prior first TMT fusion, significantly shortened first ray compared to remaining metatarsals, preoperative films also showed somewhat of a shortened ray Assessment:   1. 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 Equinus contracture of left ankle   3. Deformity of  metatarsal bone of left foot      Plan:  Patient was evaluated and treated and all questions answered.  We discussed my clinical exam findings together as well as my impression of her imaging studies both plain film x-rays or CT scan previously and the MRI that she had this year.  We also discussed her lab results.  Luckily there is no infection in the deeper tissues.  Wound care as far has been difficult.  It I did recommend that she return to using the peg assist device in surgical shoe.  So far they have just been using an antibiotic ointment and utilizing peroxide and alcohol to cleanse the wound.  I advised to discontinue this and use antibiotic soap and warm water only, we discussed that peroxide and alcohol long-term is cytotoxic to newly developing epithelium.  I recommended utilizing collagen substitution matrix such as Prisma and collagen powder and this was applied today and she will utilize this at home.  Long-term we discussed surgical revision, I do think the length of the first ray will need to be addressed with a bone graft and first MTP fusion to address both the metatarsal length as well as the extensive position of the hallux.  We discussed this in a staged manner utilizing excision of the wound and sesamoidectomy and pinning of the toe initially.  Alternatively could continue to try to heal the wound  with wound care including total contact casting.  She has trips upcoming this summer and early fall that we will make scheduling for her difficult, we will plan to proceed with total contact casting when she is able in late July or early August.  In interim she will return to see Dr. Al Corpus for ongoing wound care while I am out on leave.  Return in about 2 weeks (around 05/09/2023) for wound care.

## 2023-05-14 ENCOUNTER — Encounter: Payer: Self-pay | Admitting: Podiatry

## 2023-05-14 ENCOUNTER — Ambulatory Visit: Payer: Medicare HMO | Admitting: Podiatry

## 2023-05-14 DIAGNOSIS — L97522 Non-pressure chronic ulcer of other part of left foot with fat layer exposed: Secondary | ICD-10-CM | POA: Diagnosis not present

## 2023-05-14 NOTE — Progress Notes (Signed)
She presents today for follow-up of her ulcerative lesion subfirst metatarsal phalangeal joint of her left foot.  She states that it seems to be doing much better she states that she has been using her knee scooter some.  Objective: Vital signs are stable alert oriented x 3.  The wound on the plantar aspect of the foot is measuring 0.4 cm x 0.4 cm x 0.1 cm.  There is no purulence no malodor there is some macerated tissue with some hyperkeratosis surrounding the lesion.  Assessment: Slowly healing wounds subfirst metatarsal phalangeal joint of her left foot.  Plan: At this point I debrided all reactive hyperkeratoses just recommended that she continue the collagen powder dressings daily.  I also created an offloading shoe to help take the pressure off.  This is a Darco shoe that I cut on the bottom.

## 2023-06-11 ENCOUNTER — Ambulatory Visit: Payer: Medicare HMO | Admitting: Podiatry

## 2023-06-11 ENCOUNTER — Encounter: Payer: Self-pay | Admitting: Podiatry

## 2023-06-11 DIAGNOSIS — L97522 Non-pressure chronic ulcer of other part of left foot with fat layer exposed: Secondary | ICD-10-CM

## 2023-06-11 NOTE — Progress Notes (Signed)
  Subjective:  Patient ID: Sheila Kerr, female    DOB: 1956-04-29,  MRN: [id number]  Chief Complaint  Patient presents with   Wound Check    "I think I made it worse this week.  We did some walking and I made a new place."    67 y.o. female presents with the above complaint. History confirmed with patient.  Has been starting to improve although she was very busy last week and feels gets worse in the blister to place Betadine on this and it is improving, does have some on the outside foot as well. Objective:  Physical Exam: warm, good capillary refill, no trophic changes or ulcerative lesions, normal DP and PT pulses, and she does appear to be somewhat neuropathic on the plantar forefoot on the left foot, ulceration measuring 0.4 x 0.2 x 0.2 cm submetatarsal 1 on the left foot.  Exposed granular wound bed.  Surrounding a large hyperkeratosis.  No signs of infection.  Surrounding clearing blister anterior and proximal to this.  Lateral healing blister fifth metatarsal    MRI 03/05/2023 showed no signs of osteomyelitis  Lab work completed 02/22/2023 showed sed rate of 17 and CRP of 3  Radiographs: Multiple views x-ray of the left foot:  Prior first TMT fusion, significantly shortened first ray compared to remaining metatarsals, preoperative films also showed somewhat of a shortened ray Assessment:   1. 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  Despite increase activity and blistering present from tight wrapping and her activity last week, the wound bed itself appears to be greatly improved since my last examination of her.  We reviewed appropriate dressing and wrapping of this and not to apply dressings too tight which can cause friction blisters.  She is utilizing her surgical shoe with offloading padding that Dr. Al Corpus worked on.  I debrided the wound of nonviable tissue, hyperkeratosis to the  subcutaneous layer.  Posterior interventions are noted above.  There is no signs of infection.  She will continue to utilize collagen applications with collagen powder.  This was dispensed again today to use at home.  I will see her back in 3 weeks to reevaluate and debride the wound further.  Return in about 3 weeks (around 07/02/2023) for wound care.

## 2023-07-04 ENCOUNTER — Encounter: Payer: Self-pay | Admitting: Podiatry

## 2023-07-04 ENCOUNTER — Ambulatory Visit: Payer: Medicare HMO | Admitting: Podiatry

## 2023-07-04 DIAGNOSIS — M86672 Other chronic osteomyelitis, left ankle and foot: Secondary | ICD-10-CM | POA: Diagnosis not present

## 2023-07-04 DIAGNOSIS — L97522 Non-pressure chronic ulcer of other part of left foot with fat layer exposed: Secondary | ICD-10-CM

## 2023-07-04 MED ORDER — CEPHALEXIN 500 MG PO CAPS: 500.0000 mg | ORAL_CAPSULE | Freq: Three times a day (TID) | ORAL | 0 refills | Status: AC

## 2023-07-04 NOTE — Progress Notes (Signed)
  Subjective:  Patient ID: Sheila Kerr, female    DOB: 12/11/1955,  MRN: [id number]  Chief Complaint  Patient presents with   Wound Check    Rm1: Patient is here for wound care bottom left hallux    67 y.o. female presents with the above complaint. History confirmed with patient.  She is frustrated understandably and feels like this has worsened again.  Objective:  Physical Exam: warm, good capillary refill, no trophic changes or ulcerative lesions, normal DP and PT pulses, and she does appear to be somewhat neuropathic on the plantar forefoot on the left foot, ulceration measuring 0.4 x 0.2 x 0.2 cm submetatarsal 1 on the left foot.  Exposed granular wound bed.,  The previous blister sites now have hypergranular tissue emanating from it with a sinus tract that probes deep and an area of small fluctuance, there is no cellulitis malodor or purulent drainage   MRI 03/05/2023 showed no signs of osteomyelitis  Lab work completed 02/22/2023 showed sed rate of 17 and CRP of 3  Radiographs: Multiple views x-ray of the left foot:  Prior first TMT fusion, significantly shortened first ray compared to remaining metatarsals, preoperative films also showed somewhat of a shortened ray Assessment:   1. 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  Unfortunately continues to persist and appears to have worsened at this point and seems to have a fluctuant area in the submetatarsal and sesamoid area that is concerning for me for a deep sinus tract formation or possible abscess.  I recommended evaluating lab work and CBC CRP and ESR were ordered.  I sent her an Rx for Keflex to begin taking and an MRI has been ordered to evaluate for any deeper collection or osteomyelitis.  We discussed that if there is any development of this and surgical intervention likely  would be necessary.  I we will see her back in 3 weeks and let her know what her studies show if any intervention is necessary before that time.  Return in about 3 weeks (around 07/25/2023) for wound care, MRI and labs f/u .

## 2023-07-04 NOTE — Addendum Note (Signed)
Addended by: Kristian Covey on: 07/04/2023 05:53 PM   Modules accepted: Orders

## 2023-07-05 ENCOUNTER — Encounter: Payer: Self-pay | Admitting: Podiatry

## 2023-07-05 LAB — CBC WITH DIFFERENTIAL/PLATELET
Basophils Absolute: 0.1 10*3/uL (ref 0.0–0.2)
Basos: 1 %
EOS (ABSOLUTE): 0.2 10*3/uL (ref 0.0–0.4)
Eos: 3 %
Hematocrit: 37.7 % (ref 34.0–46.6)
Hemoglobin: 12.3 g/dL (ref 11.1–15.9)
Immature Grans (Abs): 0 10*3/uL (ref 0.0–0.1)
Immature Granulocytes: 0 %
Lymphocytes Absolute: 1.5 10*3/uL (ref 0.7–3.1)
Lymphs: 21 %
MCH: 29.3 pg (ref 26.6–33.0)
MCHC: 32.6 g/dL (ref 31.5–35.7)
MCV: 90 fL (ref 79–97)
Monocytes Absolute: 0.5 10*3/uL (ref 0.1–0.9)
Monocytes: 7 %
Neutrophils Absolute: 4.8 10*3/uL (ref 1.4–7.0)
Neutrophils: 68 %
Platelets: 238 10*3/uL (ref 150–450)
RBC: 4.2 x10E6/uL (ref 3.77–5.28)
RDW: 13.4 % (ref 11.7–15.4)
WBC: 7.1 10*3/uL (ref 3.4–10.8)

## 2023-07-05 LAB — SEDIMENTATION RATE: Sed Rate: 19 mm/h (ref 0–40)

## 2023-07-05 LAB — C-REACTIVE PROTEIN: CRP: 4 mg/L (ref 0–10)

## 2023-07-06 ENCOUNTER — Ambulatory Visit
Admission: RE | Admit: 2023-07-06 | Discharge: 2023-07-06 | Disposition: A | Discharge status: Home / Self Care | Payer: Medicare HMO | Source: Ambulatory Visit | Attending: Podiatry | Admitting: Podiatry

## 2023-07-06 DIAGNOSIS — S91302A Unspecified open wound, left foot, initial encounter: Secondary | ICD-10-CM | POA: Diagnosis not present

## 2023-07-06 DIAGNOSIS — L97522 Non-pressure chronic ulcer of other part of left foot with fat layer exposed: Secondary | ICD-10-CM

## 2023-07-06 DIAGNOSIS — M86672 Other chronic osteomyelitis, left ankle and foot: Secondary | ICD-10-CM

## 2023-07-06 MED ORDER — GADOPICLENOL 0.5 MMOL/ML IV SOLN
10.0000 mL | Freq: Once | INTRAVENOUS | Status: AC | PRN
  Administered 2023-07-06: 10 mL via INTRAVENOUS

## 2023-07-07 LAB — WOUND CULTURE
MICRO NUMBER:: 15368242
SPECIMEN QUALITY:: ADEQUATE

## 2023-07-25 ENCOUNTER — Ambulatory Visit: Payer: Medicare HMO | Admitting: Podiatry

## 2023-07-25 ENCOUNTER — Encounter: Payer: Self-pay | Admitting: Podiatry

## 2023-07-25 DIAGNOSIS — M71072 Abscess of bursa, left ankle and foot: Secondary | ICD-10-CM

## 2023-07-25 DIAGNOSIS — L97522 Non-pressure chronic ulcer of other part of left foot with fat layer exposed: Secondary | ICD-10-CM | POA: Diagnosis not present

## 2023-07-25 NOTE — Progress Notes (Signed)
Subjective:  Patient ID: Sheila Kerr, female    DOB: [birth year],  MRN: [id number]  Chief Complaint  Patient presents with   Wound Check    "It's not good, as far as I'm concerned.  Two days it will be fine then it's back."    67 y.o. female presents with the above complaint. History confirmed with patient.   Objective:  Physical Exam: warm, good capillary refill, no trophic changes or ulcerative lesions, normal DP and PT pulses, and she does appear to be somewhat neuropathic on the plantar forefoot on the left foot, remaining blister site still with granular tissue, no signs of infection cellulitis or purulent drainage.  There is serous drainage emanating deep from the anterior ulceration.   MRI 03/05/2023 showed no signs of osteomyelitis  Lab work completed 02/22/2023 showed sed rate of 17 and CRP of 3  Radiographs: Multiple views x-ray of the left foot:  Prior first TMT fusion, significantly shortened first ray compared to remaining metatarsals, preoperative films also showed somewhat of a shortened ray  IMPRESSION: 1. Small open wound along the plantar aspect of the forefoot overlying the region of the first MTP joint. There is underlying complex subcutaneous fluid collection measuring approximately 3.8 x 3.0 x 0.8 cm. No surrounding contrast enhancement to suggest this is an abscess. No surrounding inflammatory changes/cellulitis. No findings for myofasciitis or pyomyositis. Findings could be due to a sterile abscess, liquified hematoma or adventitial bursa. Aspiration may be indicated. 2. No MR findings for septic arthritis or osteomyelitis. 3. Postoperative changes as detailed above. No complicating features. 4. Advanced midfoot degenerative changes.     Electronically Signed   By: Rudie Meyer M.D.   On: 07/06/2023 14:56    Results  Collected Updated Procedure   07/04/2023 1512 07/05/2023 0739 CBC with Differential [[id number]]   Blood   Component Value  Units  WBC 7.1 x10E3/uL  RBC 4.20 x10E6/uL  Hemoglobin 12.3 g/dL  Hematocrit 78.2 %  MCV 90 fL  MCH 29.3 pg  MCHC 32.6 g/dL  RDW 95.6 %  Platelets 238 x10E3/uL  Neutrophils 68 %  Lymphs 21 %  Monocytes 7 %  Eos 3 %  Basos 1 %  Neutrophils Absolute 4.8 x10E3/uL  Lymphocytes Absolute 1.5 x10E3/uL  Monocytes Absolute 0.5 x10E3/uL  EOS (ABSOLUTE) 0.2 x10E3/uL  Basophils Absolute 0.1 x10E3/uL  Immature Granulocytes 0 %  Immature Grans (Abs) 0.0 x10E3/uL       07/04/2023 1512 07/05/2023 0739 Sedimentation Rate [[id number]]   Blood   Component Value Units  Sed Rate 19 mm/hr       07/04/2023 1512 07/05/2023 0739 C-reactive protein [[id number]]   Blood   Component Value Units  CRP 4 mg/L       07/04/2023 1753 07/07/2023 0708 WOUND CULTURE [[id number]]  Wound   Component Value  MICRO NUMBER: [id number]  SPECIMEN QUALITY: Adequate  SOURCE: WOUND  STATUS: FINAL  GRAM STAIN: No epithelial cells seen Few White blood cells seen No organisms seen  RESULT: A mix of organisms of questionable significance was recovered on culture and not further identified. (Note: Growth did not detect the presence of S.aureus, beta-hemolytic Streptococci or P.aeruginosa).        Assessment:   1. Abscess of bursa of left foot   2. 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  I reviewed the results of the MRI as well as her lab  work previously with her, we discussed there is no evidence of infection abscess or osteomyelitis which is a positive finding.  We discussed the presence of the submetatarsal bursa which is likely the source of the persistent wound that has not healed due to the fact that this continues to fill with fluid and cause pressure.  Considering her lack of healing here I recommended proceeding with surgical excision and incision and drainage of the bursa removal of the sac and irrigation of the area.  We  discussed the risk benefits and potential complications of this procedure including but not limited to  pain, swelling, infection, scar, numbness which may be temporary or permanent, chronic pain, stiffness, nerve pain or damage, wound healing problems.  All questions addressed.  Surgery is scheduled for October 14, as she has an upcoming event planned that she does not want to miss.  We discussed if this worsens at any point before then shows significant signs of infection especially some systemic signs such as fever chills nausea or vomiting that she should proceed to the ER for urgent intervention.    Surgical plan:  Procedure: -I&D with excision of bursa and wounds of left foot  Location: -[facility]  Anesthesia plan: -IV sedation with local  Postoperative pain plan: - Tylenol 1000 mg every 6 hours, ibuprofen 600 mg every 6 hours, gabapentin 300 mg every 8 hours x5 days, oxycodone 5 mg 1-2 tabs every 6 hours only as needed  DVT prophylaxis: -None required  WB Restrictions / DME needs: -NWB in [place] wedge shoe with knee scooter   No follow-ups on file.

## 2023-08-02 ENCOUNTER — Telehealth: Payer: Self-pay | Admitting: Podiatry

## 2023-08-02 NOTE — Telephone Encounter (Signed)
DOS - 08/26/2023  EXCISION GANGLION TOE LT- 40981  [employer] EFFECTIVE DATE-  11/12/2021  OOP- $3600.00 WITH REMAINING $3,060.70  COINSURANCE- 0%  PER THE [facility] PORTAL, NO PRIOR AUTH IS REQUIRED FOR CPT CODE 19147. GOOD FROM  08/26/2023 - 11/24/2023.   AUTH Tracking [id number]

## 2023-08-26 ENCOUNTER — Other Ambulatory Visit (INDEPENDENT_AMBULATORY_CARE_PROVIDER_SITE_OTHER): Payer: Medicare HMO | Admitting: Podiatry

## 2023-08-26 DIAGNOSIS — M71172 Other infective bursitis, left ankle and foot: Secondary | ICD-10-CM | POA: Diagnosis not present

## 2023-08-26 DIAGNOSIS — M21172 Varus deformity, not elsewhere classified, left ankle: Secondary | ICD-10-CM | POA: Diagnosis not present

## 2023-08-26 DIAGNOSIS — L97529 Non-pressure chronic ulcer of other part of left foot with unspecified severity: Secondary | ICD-10-CM | POA: Diagnosis not present

## 2023-08-26 DIAGNOSIS — M21072 Valgus deformity, not elsewhere classified, left ankle: Secondary | ICD-10-CM | POA: Diagnosis not present

## 2023-08-26 HISTORY — PX: FOOT SURGERY: SHX648

## 2023-08-26 MED ORDER — GABAPENTIN 300 MG PO CAPS: 300.0000 mg | ORAL_CAPSULE | Freq: Three times a day (TID) | ORAL | 0 refills | Status: DC

## 2023-08-26 MED ORDER — ACETAMINOPHEN 500 MG PO TABS: 1000.0000 mg | ORAL_TABLET | Freq: Four times a day (QID) | ORAL | 0 refills | Status: AC | PRN

## 2023-08-26 MED ORDER — IBUPROFEN 600 MG PO TABS: 600.0000 mg | ORAL_TABLET | Freq: Four times a day (QID) | ORAL | 0 refills | Status: AC | PRN

## 2023-08-26 MED ORDER — CEPHALEXIN 500 MG PO CAPS: 500.0000 mg | ORAL_CAPSULE | Freq: Three times a day (TID) | ORAL | 0 refills | Status: AC

## 2023-08-26 MED ORDER — TRAMADOL HCL 50 MG PO TABS: 50.0000 mg | ORAL_TABLET | Freq: Four times a day (QID) | ORAL | 0 refills | Status: AC | PRN

## 2023-08-26 NOTE — Progress Notes (Signed)
10/14 I&D / bursectomy left foot

## 2023-09-03 ENCOUNTER — Ambulatory Visit (INDEPENDENT_AMBULATORY_CARE_PROVIDER_SITE_OTHER): Payer: Medicare HMO | Admitting: Podiatry

## 2023-09-03 ENCOUNTER — Encounter: Payer: Self-pay | Admitting: Podiatry

## 2023-09-03 DIAGNOSIS — M71072 Abscess of bursa, left ankle and foot: Secondary | ICD-10-CM

## 2023-09-03 MED ORDER — AMOXICILLIN 500 MG PO CAPS: 500.0000 mg | ORAL_CAPSULE | Freq: Three times a day (TID) | ORAL | 0 refills | Status: DC

## 2023-09-03 MED ORDER — LEVOFLOXACIN 750 MG PO TABS: 750.0000 mg | ORAL_TABLET | Freq: Every day | ORAL | 0 refills | Status: DC

## 2023-09-04 NOTE — Progress Notes (Signed)
  Subjective:  Patient ID: Sheila Kerr, female    DOB: June 29, 1956,  MRN: [id number]  Chief Complaint  Patient presents with   Routine Post Op    PATIENT STATES THAT SHE HAS ONLY HAD PAIN IN HER KNEE NOT HER FOOT , NO MEDICATION SINCE LAST WEDNESDAY .    DOS: 07/27/2023 Procedure: Excision of abscess and bursa  67 y.o. female returns for post-op check.   Review of Systems: Negative except as noted in the HPI. Denies N/V/F/Ch.   Objective:  There were no vitals filed for this visit. There is no height or weight on file to calculate BMI. Constitutional Well developed. Well nourished.  Vascular Foot warm and well perfused. Capillary refill normal to all digits.  Calf is soft and supple, no posterior calf or knee pain, negative Homans' sign  Neurologic Normal speech. Oriented to person, place, and time. Epicritic sensation to light touch grossly present bilaterally.  Dermatologic Skin healing well without signs of infection. Skin edges well coapted without signs of infection.  Orthopedic: She has little to no pain to palpation noted about the surgical site.      Assessment:   1. Abscess of bursa of left foot    Plan:  Patient was evaluated and treated and all questions answered.  S/p foot surgery left -Progressing as expected post-operatively.  Overall doing well and healing well.  She is utilizing her knee scooter and forefoot offloading shoe.  I recommended she continue to be nonweightbearing.  She will return in 2 weeks to have her sutures and staples removed.  Rx for amoxicillin and Levaquin sent to her pharmacy per culture data.  She will continue utilizing a probiotic.  They will change the dressing every 2 to 3 days with Betadine and dry sterile dressings on the incision.  No follow-ups on file.

## 2023-09-05 ENCOUNTER — Telehealth: Payer: Self-pay | Admitting: Podiatry

## 2023-09-05 MED ORDER — METOCLOPRAMIDE HCL 5 MG PO TABS: 5.0000 mg | ORAL_TABLET | Freq: Four times a day (QID) | ORAL | 0 refills | Status: DC | PRN

## 2023-09-05 MED ORDER — AMOXICILLIN-POT CLAVULANATE 875-125 MG PO TABS
1.0000 | ORAL_TABLET | Freq: Two times a day (BID) | ORAL | 0 refills | Status: DC
Start: 2023-09-05 — End: 2023-11-05

## 2023-09-05 NOTE — Telephone Encounter (Signed)
Pt was given 2 antibiotics and took them yesterday and they have had her up sick all night. She does have sensitivity to antibiotics.  She also said she has issues with the Augmentin antibiotic as well. The one you prescribed before did not have any side effects and worked for her.  Please advise

## 2023-09-05 NOTE — Telephone Encounter (Signed)
Spoke with patient advised to stop taking the amoxicillin and Levaquin, we discussed other options I cannot be confident with her sensitivities that cephalexin alone would treat both bacteria but Augmentin would.  She had this previously and tolerated for 5 days until she developed similar symptoms but at that time was not taking a probiotic.  She will keep taking a probiotic and take the Augmentin and I am sending her Reglan as well to mitigate these effects.  She will let us know if this need to be changed to cephalexin

## 2023-09-17 ENCOUNTER — Ambulatory Visit (INDEPENDENT_AMBULATORY_CARE_PROVIDER_SITE_OTHER): Payer: Medicare HMO | Admitting: Podiatry

## 2023-09-17 DIAGNOSIS — M71072 Abscess of bursa, left ankle and foot: Secondary | ICD-10-CM

## 2023-09-17 NOTE — Patient Instructions (Signed)
Mederma is the scar gel brand that we discussed

## 2023-09-20 ENCOUNTER — Encounter: Payer: Self-pay | Admitting: Podiatry

## 2023-09-20 NOTE — Progress Notes (Signed)
  Subjective:  Patient ID: Sheila Kerr, female    DOB: Aug 02, 1956,  MRN: [id number]  Chief Complaint  Patient presents with   Follow-up    Denies pain, denies swelling, denies noticeable drainage.     DOS: 07/27/2023 Procedure: Excision of abscess and bursa  67 y.o. female returns for post-op check.  Remains well-healed  Review of Systems: Negative except as noted in the HPI. Denies N/V/F/Ch.   Objective:  There were no vitals filed for this visit. There is no height or weight on file to calculate BMI. Constitutional Well developed. Well nourished.  Vascular Foot warm and well perfused. Capillary refill normal to all digits.  Calf is soft and supple, no posterior calf or knee pain, negative Homans' sign  Neurologic Normal speech. Oriented to person, place, and time. Epicritic sensation to light touch grossly present bilaterally.  Dermatologic Skin healing well without signs of infection. Skin edges well coapted without signs of infection.  Orthopedic: She has little to no pain to palpation noted about the surgical site.      Assessment:   1. Abscess of bursa of left foot    Plan:  Patient was evaluated and treated and all questions answered.  S/p foot surgery left -Doing much better.  Sutures and staples removed.  She may begin regular bathing and gradual weightbearing on the foot.  I will see her back in 3 weeks for final follow-up. Return in about 3 weeks (around 10/08/2023) for post op (no x-rays). phalexin

## 2023-09-27 ENCOUNTER — Telehealth: Payer: Self-pay

## 2023-09-27 NOTE — Telephone Encounter (Signed)
I am not sure who this needs to go to. Thanks  Copied from CRM [id number]. Topic: Medical Record Request - Records Request >> Sep 26, 2023  4:29 PM Mosetta Putt H wrote: Reason for CRM: Need to request records again

## 2023-10-08 ENCOUNTER — Ambulatory Visit (INDEPENDENT_AMBULATORY_CARE_PROVIDER_SITE_OTHER): Payer: Medicare HMO | Admitting: Podiatry

## 2023-10-08 ENCOUNTER — Encounter: Payer: Self-pay | Admitting: Podiatry

## 2023-10-08 DIAGNOSIS — L97522 Non-pressure chronic ulcer of other part of left foot with fat layer exposed: Secondary | ICD-10-CM | POA: Diagnosis not present

## 2023-10-08 NOTE — Progress Notes (Signed)
  Subjective:  Patient ID: Sheila Kerr, female    DOB: 1956-06-03,  MRN: [id number]  Chief Complaint  Patient presents with   Routine Post Op    POV # 1 DOS 08/26/23 -- BURSA EXCISION LEFT FOOT   "Its doing okay, drains and bleeds some"    DOS: 07/27/2023 Procedure: Excision of abscess and bursa  67 y.o. female returns for post-op check.  There is some drainage and opening of the incision  Review of Systems: Negative except as noted in the HPI. Denies N/V/F/Ch.   Objective:  There were no vitals filed for this visit. There is no height or weight on file to calculate BMI. Constitutional Well developed. Well nourished.  Vascular Foot warm and well perfused. Capillary refill normal to all digits.  Calf is soft and supple, no posterior calf or knee pain, negative Homans' sign  Neurologic Normal speech. Oriented to person, place, and time. Epicritic sensation to light touch grossly present bilaterally.  Dermatologic Residual ulceration measuring 1.1 x 0.3 x 0.3 cm with exposed subcutaneous tissue and surrounding hyperkeratosis no signs of infection  Orthopedic: She has little to no pain to palpation noted about the surgical site.       Assessment:   1. 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  S/p foot surgery left -Still has some residual ulceration I debrided the area and wound bed of surrounding hyperkeratosis slough and biofilm to the subcutaneous layer and excisional manner with a sharp scalpel.  She tolerated this well.  Hemostasis was achieved manually and the foot was dressed with Promogran and dry sterile dressings.  She will continue this at home and change daily.  Follow-up with me in 3 weeks to reevaluate.  No follow-ups on file.

## 2023-10-28 ENCOUNTER — Encounter: Payer: Self-pay | Admitting: Podiatry

## 2023-10-28 ENCOUNTER — Ambulatory Visit (INDEPENDENT_AMBULATORY_CARE_PROVIDER_SITE_OTHER): Payer: Medicare HMO | Admitting: Podiatry

## 2023-10-28 DIAGNOSIS — L97522 Non-pressure chronic ulcer of other part of left foot with fat layer exposed: Secondary | ICD-10-CM | POA: Diagnosis not present

## 2023-10-29 ENCOUNTER — Telehealth: Payer: Self-pay

## 2023-10-29 DIAGNOSIS — L97522 Non-pressure chronic ulcer of other part of left foot with fat layer exposed: Secondary | ICD-10-CM | POA: Diagnosis not present

## 2023-10-29 NOTE — Progress Notes (Signed)
  Subjective:  Patient ID: Sheila Kerr, female    DOB: [birth year],  MRN: [id number]  Chief Complaint  Patient presents with   Routine Post Op    POV  DOS 08/26/23 -- BURSA EXCISION LEFT FOOT   "One day it looks really good, the next day, it looks really bad.  The problem is that I don't stay off of it enough."    DOS: 07/27/2023 Procedure: Excision of abscess and bursa  67 y.o. female returns for post-op check.    Review of Systems: Negative except as noted in the HPI. Denies N/V/F/Ch.   Objective:  There were no vitals filed for this visit. There is no height or weight on file to calculate BMI. Constitutional Well developed. Well nourished.  Vascular Foot warm and well perfused. Capillary refill normal to all digits.  Calf is soft and supple, no posterior calf or knee pain, negative Homans' sign  Neurologic Normal speech. Oriented to person, place, and time. Epicritic sensation to light touch grossly present bilaterally.  Dermatologic Residual ulceration measuring 1.5 x 1.1 x 0.3 cm with exposed subcutaneous tissue and surrounding hyperkeratosis no signs of infection  Orthopedic: She has little to no pain to palpation noted about the surgical site.       Assessment:   1. 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  S/p foot surgery left -Significant increase in size now on the ulceration.  I debrided the area and wound bed of surrounding hyperkeratosis slough and biofilm to the subcutaneous layer and excisional manner with a sharp scalpel.  She tolerated this well.  Hemostasis was achieved manually and the foot was dressed with Promogran and dry sterile dressings.  She will continue this at home and change daily or every other day.  I recommended returning to her forefoot offloading shoe to help offload the ulceration.  I discussed the would recommend we proceed with total contact casting.   Referral for collagen dressings was sent to [facility].  Treatments Return in about 3 weeks (around 11/18/2023) for wound care, possible total contact casting.

## 2023-10-29 NOTE — Telephone Encounter (Signed)
Order form, demographics and office note faxed to [facility] [phone]  Confirmation received

## 2023-10-29 NOTE — Telephone Encounter (Signed)
-----   Message from Edwin Cap sent at 10/29/2023  8:09 AM EST ----- Can you send a [facility] order for Prisma collagen matrix with ICD 10 L97.522 Left foot measurements 1.5 x 1.1 x 0.3 cm mild drainage does not need any other supplies

## 2023-11-05 ENCOUNTER — Ambulatory Visit (INDEPENDENT_AMBULATORY_CARE_PROVIDER_SITE_OTHER): Payer: Medicare HMO | Admitting: Family Medicine

## 2023-11-05 ENCOUNTER — Encounter: Payer: Self-pay | Admitting: Family Medicine

## 2023-11-05 VITALS — BP 150/88 | Ht 65.0 in | Wt 221.0 lb

## 2023-11-05 DIAGNOSIS — R011 Cardiac murmur, unspecified: Secondary | ICD-10-CM

## 2023-11-05 DIAGNOSIS — E782 Mixed hyperlipidemia: Secondary | ICD-10-CM

## 2023-11-05 DIAGNOSIS — I10 Essential (primary) hypertension: Secondary | ICD-10-CM | POA: Diagnosis not present

## 2023-11-05 DIAGNOSIS — Z23 Encounter for immunization: Secondary | ICD-10-CM | POA: Diagnosis not present

## 2023-11-05 DIAGNOSIS — E039 Hypothyroidism, unspecified: Secondary | ICD-10-CM | POA: Diagnosis not present

## 2023-11-05 NOTE — Progress Notes (Signed)
New Patient Office Visit  Subjective    Patient ID: Sheila Kerr, female    DOB: 12/26/1955  Age: 67 y.o. MRN: [id number]  CC:  Chief Complaint  Patient presents with   Establish Care    HPI DIORE KAPOLKA presents to establish care. Oriented to practice routines and expectations. Has been seeing a PCP regularly, most recent physical with labs was 02/2023. PMH includes hypothyroidism, HTN, allergies, HLD, asthma, GERD, prediabetes.   Breast CA screening: Mammogram status: Completed 12/2022. Repeat every year Cervical CA screening:  hysterectomy for non-cancer reasons Colon CA screening: colonoscopy 2 years ago without abnormalities. DEXA: DEXA scan, most current DEXA scan T-score is minus unsure, 2 years ago, no osteopenia or osteoporosis. Tobacco: non-smoker STI: declines Vaccines:  PNA, shingles completed, Covid 2 doses, flu today  HYPERTENSION / HYPERLIPIDEMIA Satisfied with current treatment? yes Duration of hypertension: chronic BP monitoring frequency: rarely BP range: 120/70s BP medication side effects: no Past BP meds: HCTZ and lisinopril Duration of hyperlipidemia: chronic Cholesterol medication side effects: no Cholesterol supplements: none Past cholesterol medications: ezetimide (zetia) Medication compliance: excellent compliance Aspirin: no Recent stressors: no Recurrent headaches: no Visual changes: no Palpitations: no Dyspnea: no Chest pain: no Lower extremity edema: no Dizzy/lightheaded: no   Outpatient Encounter Medications as of 11/05/2023  Medication Sig   albuterol (VENTOLIN HFA) 108 (90 Base) MCG/ACT inhaler    ascorbic acid (VITAMIN C) 500 MG tablet Take 500 mg by mouth daily.   Cholecalciferol (VITAMIN D3) 50 MCG (2000 UT) CHEW Chew by mouth.   Collagen-Vitamin C-Biotin (COLLAGEN PO) Take 600 mg by mouth.   diclofenac (VOLTAREN) 50 MG EC tablet Take 50 mg by mouth 2 (two) times daily.   ezetimibe (ZETIA) 10 MG tablet     FLUTICASONE PROPIONATE HFA IN Inhale into the lungs.   hydrochlorothiazide (HYDRODIURIL) 12.5 MG tablet    levothyroxine (SYNTHROID) 100 MCG tablet    lisinopril (ZESTRIL) 20 MG tablet    Multiple Vitamins-Minerals (ALIVE DIABETIC MULTIVITAMIN PO) Take by mouth.   omeprazole (PRILOSEC) 40 MG capsule SMARTSIG:1 Capsule(s) By Mouth Every Evening   OVER THE COUNTER MEDICATION Pt takes ALLERTEC   [DISCONTINUED] fluticasone-salmeterol (ADVAIR) 250-50 MCG/ACT AEPB Inhale 1 puff into the lungs 2 (two) times daily.   [DISCONTINUED] amoxicillin-clavulanate (AUGMENTIN) 875-125 MG tablet Take 1 tablet by mouth 2 (two) times daily. (Patient not taking: Reported on 11/05/2023)   [DISCONTINUED] fexofenadine-pseudoephedrine (ALLEGRA-D 24) 180-240 MG 24 hr tablet Take 1 tablet by mouth daily. (Patient not taking: Reported on 11/05/2023)   [DISCONTINUED] gabapentin (NEURONTIN) 300 MG capsule Take 1 capsule (300 mg total) by mouth 3 (three) times daily for 7 days.   [DISCONTINUED] metoCLOPramide (REGLAN) 5 MG tablet Take 1 tablet (5 mg total) by mouth every 6 (six) hours as needed for up to 7 days for nausea or vomiting.   [DISCONTINUED] mupirocin ointment (BACTROBAN) 2 % Apply to wound after soaking BID (Patient not taking: Reported on 11/05/2023)   [DISCONTINUED] Selenium (SELENOMAX PO) Take by mouth. (Patient not taking: Reported on 11/05/2023)   No facility-administered encounter medications on file as of 11/05/2023.    Past Medical History:  Diagnosis Date   Allergy    Asthma    GERD (gastroesophageal reflux disease)    Hyperlipidemia    Hypertension    Thyroid disease     Past Surgical History:  Procedure Laterality Date   ABDOMINAL HYSTERECTOMY     FOOT SURGERY  08/26/2023   both foot one in 2023  History reviewed. No pertinent family history.  Social History   Socioeconomic History   Marital status: Married    Spouse name: Not on file   Number of children: Not on file   Years of  education: Not on file   Highest education level: Not on file  Occupational History   Not on file  Tobacco Use   Smoking status: Never   Smokeless tobacco: Never  Substance and Sexual Activity   Alcohol use: Not Currently   Drug use: Never   Sexual activity: Not on file  Other Topics Concern   Not on file  Social History Narrative   Not on file   Social Drivers of Health   Financial Resource Strain: Not on file  Food Insecurity: Not on file  Transportation Needs: Not on file  Physical Activity: Not on file  Stress: Not on file  Social Connections: Unknown (03/23/2022)   Received from [facility], [facility]   Social Network    Social Network: Not on file  Intimate Partner Violence: Unknown (02/12/2022)   Received from [facility], [facility]   HITS    Physically Hurt: Not on file    Insult or Talk Down To: Not on file    Threaten Physical Harm: Not on file    Scream or Curse: Not on file    ROS      Objective    BP (!) 150/88   Ht 5\' 5"  (1.651 m)   Wt 221 lb (100.2 kg)   BMI 36.78 kg/m   Physical Exam Vitals and nursing note reviewed.  Constitutional:      Appearance: Normal appearance. She is normal weight.  HENT:     Head: Normocephalic and atraumatic.  Cardiovascular:     Rate and Rhythm: Normal rate and regular rhythm.     Pulses: Normal pulses.     Heart sounds: Murmur heard.  Pulmonary:     Effort: Pulmonary effort is normal.     Breath sounds: Normal breath sounds.  Skin:    General: Skin is warm and dry.  Neurological:     General: No focal deficit present.     Mental Status: She is alert and oriented to person, place, and time. Mental status is at baseline.  Psychiatric:        Mood and Affect: Mood normal.        Behavior: Behavior normal.        Thought Content: Thought content normal.        Judgment: Judgment normal.     Last CBC Lab Results  Component Value Date   WBC 7.1 07/04/2023   HGB 12.3 07/04/2023   HCT 37.7  07/04/2023   MCV 90 07/04/2023   MCH 29.3 07/04/2023   RDW 13.4 07/04/2023   PLT 238 07/04/2023   Last metabolic panel Lab Results  Component Value Date   EGFR 64.0 02/25/2023   Last lipids No results found for: "CHOL", "HDL", "LDLCALC", "LDLDIRECT", "TRIG", "CHOLHDL" Last hemoglobin A1c No results found for: "HGBA1C" Last thyroid functions No results found for: "TSH", "T3TOTAL", "T4TOTAL", "THYROIDAB" Last vitamin D No results found for: "25OHVITD2", "25OHVITD3", "VD25OH" Last vitamin B12 and Folate No results found for: "VITAMINB12", "FOLATE"      Assessment & Plan:   Problem List Items Addressed This Visit     Murmur   New murmur. Will order ECHO for further evaluation.       Relevant Orders   ECHOCARDIOGRAM COMPLETE   Hypothyroidism  Chronic. Continue Synthroid daily. Fasting labs ordered.      Morbid obesity (HCC)   Counseled on importance of weight management for overall health. Encouraged low calorie, heart healthy diet and moderate intensity exercise 150 minutes weekly. This is 3-5 times weekly for 30-50 minutes each session. Goal should be pace of 3 miles/hours, or walking 1.5 miles in 30 minutes and include strength training. Discussed risks of obesity.      Primary hypertension - Primary   BP elevated 150/88 today in office. She will monitor at home and bring readings to her physical. Recommend heart healthy diet such as Mediterranean diet with whole grains, fruits, vegetable, fish, lean meats, nuts, and olive oil. Limit salt. Encouraged moderate walking, 3-5 times/week for 30-50 minutes each session. Aim for at least 150 minutes.week. Goal should be pace of 3 miles/hours, or walking 1.5 miles in 30 minutes. Avoid tobacco products. Avoid excess alcohol. Take medications as prescribed and bring medications and blood pressure log with cuff to each office visit. Seek medical care for chest pain, palpitations, shortness of breath with exertion,  dizziness/lightheadedness, vision changes, recurrent headaches, or swelling of extremities.       Mixed hyperlipidemia   Continue Zetia. Doesn't tolerate statins due to cough. I recommend consuming a heart healthy diet such as Mediterranean diet or DASH diet with whole grains, fruits, vegetable, fish, lean meats, nuts, and olive oil. Limit sweets and processed foods. I also encourage moderate intensity exercise 150 minutes weekly. This is 3-5 times weekly for 30-50 minutes each session. Goal should be pace of 3 miles/hours, or walking 1.5 miles in 30 minutes. The ASCVD Risk score (Arnett DK, et al., 2019) failed to calculate for the following reasons:   Cannot find a previous HDL lab   Cannot find a previous total cholesterol lab       Other Visit Diagnoses       Flu vaccine need       Relevant Orders   Flu Vaccine Trivalent High Dose (Fluad) (Completed)       Return in about 4 months (around 03/05/2024) for annual physical with labs 1 week prior.   Park Meo, FNP

## 2023-11-07 ENCOUNTER — Ambulatory Visit: Payer: Medicare HMO | Admitting: Family Medicine

## 2023-11-11 DIAGNOSIS — E039 Hypothyroidism, unspecified: Secondary | ICD-10-CM | POA: Insufficient documentation

## 2023-11-11 DIAGNOSIS — I10 Essential (primary) hypertension: Secondary | ICD-10-CM | POA: Insufficient documentation

## 2023-11-11 DIAGNOSIS — E782 Mixed hyperlipidemia: Secondary | ICD-10-CM | POA: Insufficient documentation

## 2023-11-11 DIAGNOSIS — R011 Cardiac murmur, unspecified: Secondary | ICD-10-CM | POA: Insufficient documentation

## 2023-11-11 NOTE — Assessment & Plan Note (Signed)
Continue Zetia. Doesn't tolerate statins due to cough. I recommend consuming a heart healthy diet such as Mediterranean diet or DASH diet with whole grains, fruits, vegetable, fish, lean meats, nuts, and olive oil. Limit sweets and processed foods. I also encourage moderate intensity exercise 150 minutes weekly. This is 3-5 times weekly for 30-50 minutes each session. Goal should be pace of 3 miles/hours, or walking 1.5 miles in 30 minutes. The ASCVD Risk score (Arnett DK, et al., 2019) failed to calculate for the following reasons:   Cannot find a previous HDL lab   Cannot find a previous total cholesterol lab

## 2023-11-11 NOTE — Assessment & Plan Note (Signed)
 Counseled on importance of weight management for overall health. Encouraged low calorie, heart healthy diet and moderate intensity exercise 150 minutes weekly. This is 3-5 times weekly for 30-50 minutes each session. Goal should be pace of 3 miles/hours, or walking 1.5 miles in 30 minutes and include strength training. Discussed risks of obesity.

## 2023-11-11 NOTE — Assessment & Plan Note (Signed)
New murmur. Will order ECHO for further evaluation.

## 2023-11-11 NOTE — Assessment & Plan Note (Signed)
BP elevated 150/88 today in office. She will monitor at home and bring readings to her physical. Recommend heart healthy diet such as Mediterranean diet with whole grains, fruits, vegetable, fish, lean meats, nuts, and olive oil. Limit salt. Encouraged moderate walking, 3-5 times/week for 30-50 minutes each session. Aim for at least 150 minutes.week. Goal should be pace of 3 miles/hours, or walking 1.5 miles in 30 minutes. Avoid tobacco products. Avoid excess alcohol. Take medications as prescribed and bring medications and blood pressure log with cuff to each office visit. Seek medical care for chest pain, palpitations, shortness of breath with exertion, dizziness/lightheadedness, vision changes, recurrent headaches, or swelling of extremities.

## 2023-11-11 NOTE — Assessment & Plan Note (Signed)
Chronic. Continue Synthroid daily. Fasting labs ordered.

## 2023-11-15 ENCOUNTER — Ambulatory Visit (HOSPITAL_COMMUNITY): Payer: Medicare HMO | Attending: Cardiovascular Disease

## 2023-11-15 DIAGNOSIS — R011 Cardiac murmur, unspecified: Secondary | ICD-10-CM | POA: Diagnosis not present

## 2023-11-15 LAB — ECHOCARDIOGRAM COMPLETE
Area-P 1/2: 4.46 cm2
S' Lateral: 2.6 cm

## 2023-11-19 ENCOUNTER — Ambulatory Visit (INDEPENDENT_AMBULATORY_CARE_PROVIDER_SITE_OTHER): Payer: Medicare HMO | Admitting: Podiatry

## 2023-11-19 DIAGNOSIS — L97522 Non-pressure chronic ulcer of other part of left foot with fat layer exposed: Secondary | ICD-10-CM

## 2023-11-19 NOTE — Progress Notes (Signed)
  Subjective:  Patient ID: Sheila Kerr, female    DOB: 09-Sep-1956,  MRN: [id number]  Chief Complaint  Patient presents with   Wound Check    She feels she is at a standstill  with the wound. She thinks she will be going out in a cast.     DOS: 07/27/2023 Procedure: Excision of abscess and bursa  68 y.o. female returns for post-op check.    Review of Systems: Negative except as noted in the HPI. Denies N/V/F/Ch.   Objective:  There were no vitals filed for this visit. There is no height or weight on file to calculate BMI. Constitutional Well developed. Well nourished.  Vascular Foot warm and well perfused. Capillary refill normal to all digits.  Calf is soft and supple, no posterior calf or knee pain, negative Homans' sign  Neurologic Normal speech. Oriented to person, place, and time. Epicritic sensation to light touch grossly present bilaterally.  Dermatologic Residual ulceration measuring 1.5 x 1.2 x 0.3 cm with exposed subcutaneous tissue and surrounding hyperkeratosis no signs of infection  Orthopedic: She has little to no pain to palpation noted about the surgical site.       Assessment:   1. 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  S/p foot surgery left -Wound unchanged dimensions and appearance and size visit other than return of hyperkeratosis.  I debrided the area and wound bed of surrounding hyperkeratosis slough and biofilm to the subcutaneous layer and excisional manner with a sharp scalpel.  She tolerated this well.  Hemostasis was achieved manually and the foot was dressed with Promogran and dry sterile dressings.  Due to the stagnation I recommended beginning total contact casting.  Following dressing of the wound the total contact cast was applied in a cast boot applied.  She will be weightbearing as tolerated in this.  She will return in 1 week for cast change and  debridement  Return in about 1 week (around 11/26/2023) for total contact casting, wound care.

## 2023-11-26 ENCOUNTER — Ambulatory Visit (INDEPENDENT_AMBULATORY_CARE_PROVIDER_SITE_OTHER): Payer: Medicare HMO | Admitting: Podiatry

## 2023-11-26 ENCOUNTER — Encounter: Payer: Self-pay | Admitting: Podiatry

## 2023-11-26 VITALS — Ht 65.0 in | Wt 221.0 lb

## 2023-11-26 DIAGNOSIS — L97522 Non-pressure chronic ulcer of other part of left foot with fat layer exposed: Secondary | ICD-10-CM

## 2023-11-28 ENCOUNTER — Encounter: Payer: Self-pay | Admitting: Podiatry

## 2023-11-28 NOTE — Progress Notes (Signed)
  Subjective:  Patient ID: Sheila Kerr, female    DOB: 1956-07-17,  MRN: [id number]  Chief Complaint  Patient presents with   Foot Ulcer    She reports she is here for wound care and getting a smell to the cast.     DOS: 07/27/2023 Procedure: Excision of abscess and bursa  68 y.o. female returns for post-op check.    Review of Systems: Negative except as noted in the HPI. Denies N/V/F/Ch.   Objective:  There were no vitals filed for this visit. Body mass index is 36.78 kg/m. Constitutional Well developed. Well nourished.  Vascular Foot warm and well perfused. Capillary refill normal to all digits.  Calf is soft and supple, no posterior calf or knee pain, negative Homans' sign  Neurologic Normal speech. Oriented to person, place, and time. Epicritic sensation to light touch grossly present bilaterally.  Dermatologic Residual ulceration measuring 1.4 x 2.0 x 0.3 cm with exposed subcutaneous tissue and surrounding hyperkeratosis no signs of infection  Orthopedic: She has little to no pain to palpation noted about the surgical site.       Assessment:   1. 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  S/p foot surgery left -Wound unchanged dimensions and appearance and size visit other than return of hyperkeratosis.  I debrided the area and wound bed of surrounding hyperkeratosis slough and biofilm to the subcutaneous layer and excisional manner with a sharp scalpel.  She tolerated this well.  Hemostasis was achieved manually and the foot was dressed with Iodosorb and dry sterile dressings.  I recommended that we continue total contact casting.  Following dressing of the wound the well-padded total contact cast was applied and a cast boot applied.  She will be weightbearing as tolerated in this.  She will return in 1 week for cast change and debridement  No follow-ups on file.

## 2023-12-03 ENCOUNTER — Ambulatory Visit: Payer: Medicare HMO | Admitting: Podiatry

## 2023-12-03 DIAGNOSIS — L97522 Non-pressure chronic ulcer of other part of left foot with fat layer exposed: Secondary | ICD-10-CM

## 2023-12-05 ENCOUNTER — Encounter: Payer: Self-pay | Admitting: Podiatry

## 2023-12-05 DIAGNOSIS — S81802A Unspecified open wound, left lower leg, initial encounter: Secondary | ICD-10-CM

## 2023-12-05 NOTE — Progress Notes (Signed)
  Subjective:  Patient ID: Sheila Kerr, female    DOB: [birth year],  MRN: [id number]  Chief Complaint  Patient presents with   Wound Check    She is here for a wound check and cast off.    DOS: 07/27/2023 Procedure: Excision of abscess and bursa  68 y.o. female returns for post-op check.    Review of Systems: Negative except as noted in the HPI. Denies N/V/F/Ch.   Objective:  There were no vitals filed for this visit. There is no height or weight on file to calculate BMI. Constitutional Well developed. Well nourished.  Vascular Foot warm and well perfused. Capillary refill normal to all digits.  Calf is soft and supple, no posterior calf or knee pain, negative Homans' sign  Neurologic Normal speech. Oriented to person, place, and time. Epicritic sensation to light touch grossly present bilaterally.  Dermatologic Residual ulceration measuring 1.4 x 2.4 x 0.3 cm with exposed subcutaneous tissue and surrounding hyperkeratosis no signs of infection  Orthopedic: She has little to no pain to palpation noted about the surgical site.       Assessment:   1. 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  S/p foot surgery left -Wound larger today.  No signs of infection.  I debrided the area and wound bed of surrounding hyperkeratosis slough and biofilm to the subcutaneous layer and excisional manner with a sharp scalpel.  She tolerated this well.  Hemostasis was achieved manually and the foot was dressed with Iodosorb and dry sterile dressings.  No cast applied today due to the increase in size and we do not have an appropriate cast that would fit her correctly.  At this point we may really examine other options including skin substitute application.  She is not diabetic but does have polyneuropathy and thus far has failed several months of local wound care and attempted surgical debridement and excision.   May recheck A1c as it has not checked in 18 months but has never been diabetic.  The wound does have a fully granular wound bed.  A skin substitute application would provide a collagen matrix for epithelialization and infected wound.  He resists antimicrobial colonization.  Authorization for [facility] application will be submitted.  I will see her back in 1 week.  Alternatively could consider long-term excision of sesamoids with pinning of MTP and excision of wound with staged first metatarsal phalangeal joint fusion  No follow-ups on file.

## 2023-12-06 DIAGNOSIS — S81802A Unspecified open wound, left lower leg, initial encounter: Secondary | ICD-10-CM | POA: Diagnosis not present

## 2023-12-06 DIAGNOSIS — E46 Unspecified protein-calorie malnutrition: Secondary | ICD-10-CM | POA: Diagnosis not present

## 2023-12-10 ENCOUNTER — Encounter: Payer: Self-pay | Admitting: Podiatry

## 2023-12-10 ENCOUNTER — Ambulatory Visit: Payer: Medicare HMO | Admitting: Podiatry

## 2023-12-10 DIAGNOSIS — L97522 Non-pressure chronic ulcer of other part of left foot with fat layer exposed: Secondary | ICD-10-CM

## 2023-12-10 NOTE — Progress Notes (Signed)
  Subjective:  Patient ID: Sheila Kerr, female    DOB: 09-08-1956,  MRN: [id number]  Chief Complaint  Patient presents with   Routine Post Op    Patient states she is supposed to get a cast put on today.    DOS: 07/27/2023 Procedure: Excision of abscess and bursa  68 y.o. female returns for post-op check.    Review of Systems: Negative except as noted in the HPI. Denies N/V/F/Ch.   Objective:  There were no vitals filed for this visit. There is no height or weight on file to calculate BMI. Constitutional Well developed. Well nourished.  Vascular Foot warm and well perfused. Capillary refill normal to all digits.  Calf is soft and supple, no posterior calf or knee pain, negative Homans' sign  Neurologic Normal speech. Oriented to person, place, and time. Epicritic sensation to light touch grossly present bilaterally.  Dermatologic Residual ulceration measuring 2.0 x 1.4 x 0.3 cm with exposed subcutaneous tissue and surrounding hyperkeratosis no signs of infection, central hypergranulation tissue  Orthopedic: She has little to no pain to palpation noted about the surgical site.       Assessment:   1. 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  S/p foot surgery left -Wound slight improvement in dimensions and appearance and size, new hypergranulation tissue.  I debrided the area and wound bed of surrounding hyperkeratosis slough and biofilm to the subcutaneous layer and excisional manner with a sharp scalpel.  The prominent hypergranulation tissue was excised and cauterized with silver nitrate.  She tolerated this well.  Hemostasis was achieved manually and the foot was dressed with Promogran and dry sterile dressings.  Today we resumed total contact casting.  Following dressing of the wound the total contact cast was applied in a cast boot applied.  She will be weightbearing as tolerated in  this.  She will return in 1 week for cast change and debridement  No follow-ups on file.

## 2023-12-12 LAB — VITAMIN C: Vitamin C: 2 mg/dL (ref 0.4–2.0)

## 2023-12-12 LAB — HEMOGLOBIN A1C
Est. average glucose Bld gHb Est-mCnc: 114 mg/dL
Hgb A1c MFr Bld: 5.6 % (ref 4.8–5.6)

## 2023-12-12 LAB — ZINC: Zinc: 79 ug/dL (ref 44–115)

## 2023-12-12 LAB — VITAMIN B12: Vitamin B-12: 805 pg/mL (ref 232–1245)

## 2023-12-12 LAB — VITAMIN A: Vitamin A: 51.2 ug/dL (ref 22.0–69.5)

## 2023-12-12 LAB — VITAMIN D 25 HYDROXY (VIT D DEFICIENCY, FRACTURES): Vit D, 25-Hydroxy: 49.5 ng/mL (ref 30.0–100.0)

## 2023-12-12 LAB — ALBUMIN: Albumin: 4.2 g/dL (ref 3.9–4.9)

## 2023-12-12 LAB — PREALBUMIN: PREALBUMIN: 25 mg/dL (ref 10–36)

## 2023-12-17 ENCOUNTER — Ambulatory Visit: Payer: Medicare HMO | Admitting: Podiatry

## 2023-12-17 ENCOUNTER — Encounter: Payer: Self-pay | Admitting: Podiatry

## 2023-12-17 DIAGNOSIS — L97522 Non-pressure chronic ulcer of other part of left foot with fat layer exposed: Secondary | ICD-10-CM | POA: Diagnosis not present

## 2023-12-17 NOTE — Progress Notes (Signed)
  Subjective:  Patient ID: Sheila Kerr, female    DOB: Nov 15, 1955,  MRN: [id number]  Chief Complaint  Patient presents with   Routine Post Op    Patient states everything has been ok since last visit    DOS: 07/27/2023 Procedure: Excision of abscess and bursa  68 y.o. female returns for post-op check.    Review of Systems: Negative except as noted in the HPI. Denies N/V/F/Ch.   Objective:  There were no vitals filed for this visit. There is no height or weight on file to calculate BMI. Constitutional Well developed. Well nourished.  Vascular Foot warm and well perfused. Capillary refill normal to all digits.  Calf is soft and supple, no posterior calf or knee pain, negative Homans' sign  Neurologic Normal speech. Oriented to person, place, and time. Epicritic sensation to light touch grossly present bilaterally.  Dermatologic Residual ulceration measuring 1.2 x 1.2 x 0.3 cm with exposed subcutaneous tissue and surrounding hyperkeratosis no signs of infection, improved in appearance  Orthopedic: She has little to no pain to palpation noted about the surgical site.       Assessment:   1. 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  S/p foot surgery left -Wound slight improvement in dimensions and appearance and size.  I debrided the area and wound bed of surrounding hyperkeratosis slough and biofilm to the subcutaneous layer and excisional manner with a sharp scalpel.  Wound dimensions and appearance are improved.  She tolerated this well.  Hemostasis was achieved manually and the foot was dressed with Promogran, Hydrofera Blue, Mepitel and dry sterile dressings.  Today we continued total contact casting.  Following dressing of the wound the total contact cast was applied in a cast boot applied.  She will be weightbearing as tolerated in this.  She will return in 1 week for cast change and  debridement  Return in about 1 week (around 12/24/2023) for total contact casting, wound care.

## 2023-12-24 ENCOUNTER — Ambulatory Visit (INDEPENDENT_AMBULATORY_CARE_PROVIDER_SITE_OTHER): Payer: Medicare HMO | Admitting: Podiatry

## 2023-12-24 ENCOUNTER — Encounter: Payer: Self-pay | Admitting: Podiatry

## 2023-12-24 DIAGNOSIS — L97522 Non-pressure chronic ulcer of other part of left foot with fat layer exposed: Secondary | ICD-10-CM | POA: Diagnosis not present

## 2023-12-24 NOTE — Progress Notes (Signed)
  Subjective:  Patient ID: Sheila Kerr, female    DOB: [birth year],  MRN: [id number]  Chief Complaint  Patient presents with   casting wound care    Patient states everything has been the same since last visit    DOS: 07/27/2023 Procedure: Excision of abscess and bursa  68 y.o. female returns for post-op check.    Review of Systems: Negative except as noted in the HPI. Denies N/V/F/Ch.   Objective:  There were no vitals filed for this visit. There is no height or weight on file to calculate BMI. Constitutional Well developed. Well nourished.  Vascular Foot warm and well perfused. Capillary refill normal to all digits.  Calf is soft and supple, no posterior calf or knee pain, negative Homans' sign  Neurologic Normal speech. Oriented to person, place, and time. Epicritic sensation to light touch grossly present bilaterally.  Dermatologic Residual ulceration measuring 1.2 x 1.0 x 0.2 cm with exposed subcutaneous tissue and surrounding hyperkeratosis no signs of infection, improved in appearance  Orthopedic: She has little to no pain to palpation noted about the surgical site.       Assessment:   1. 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  S/p foot surgery left -Wound slight improvement in dimensions and appearance and size.  I debrided the area and wound bed of surrounding hyperkeratosis slough and biofilm to the subcutaneous layer and excisional manner with a sharp scalpel.  Wound dimensions and appearance are improved.  She tolerated this well.  Hemostasis was achieved manually and the foot was dressed with Promogran, Maxorb, and dry sterile dressings.  Today we continued total contact casting.  Following dressing of the wound the total contact cast was applied in a cast boot applied.  She will be weightbearing as tolerated in this.  She will return in 1 week for cast change and  debridement  Return in about 1 week (around 12/31/2023) for wound care, total contact casting.

## 2023-12-30 DIAGNOSIS — R92323 Mammographic fibroglandular density, bilateral breasts: Secondary | ICD-10-CM | POA: Diagnosis not present

## 2023-12-30 DIAGNOSIS — Z1231 Encounter for screening mammogram for malignant neoplasm of breast: Secondary | ICD-10-CM | POA: Diagnosis not present

## 2023-12-30 LAB — HM MAMMOGRAPHY

## 2023-12-31 ENCOUNTER — Encounter: Payer: Self-pay | Admitting: Podiatry

## 2023-12-31 ENCOUNTER — Ambulatory Visit: Payer: Medicare HMO | Admitting: Podiatry

## 2023-12-31 DIAGNOSIS — L97522 Non-pressure chronic ulcer of other part of left foot with fat layer exposed: Secondary | ICD-10-CM

## 2023-12-31 NOTE — Progress Notes (Signed)
 She presents today ambulating in her contact cast.  She is seeing me today because Dr. Lilian Kapur went home sick.  She denies fever chills nausea vomiting muscle aches pains.  Objective: Vital signs are stable alert oriented x 3.  Total contact cast was intact and was removed with dry sterile dressing.  Wound appears to be more granular than last visit though centrally there is a small hole that does not probe very deeply.  I was able to resect some of his granulation tissue today and placed some silver nitrate to assist in the antibiotic effect as well as help control bleeding.  Wound does not demonstrate considerable amount of fibrosis though I did debride the area sharply today.  Assessment: Slowly healing wound from plantar aspect left foot.  Plan: Discussed etiology pathology conservative surgical therapies at this point dry sterile dressing was placed and another total contact cast was applied once allowed to dry she was able to ambulate and will follow-up with Dr. Lilian Kapur next Tuesday.

## 2024-01-07 ENCOUNTER — Other Ambulatory Visit: Payer: Self-pay | Admitting: Podiatry

## 2024-01-07 ENCOUNTER — Ambulatory Visit: Payer: Medicare HMO | Admitting: Podiatry

## 2024-01-07 ENCOUNTER — Encounter: Payer: Self-pay | Admitting: Podiatry

## 2024-01-07 DIAGNOSIS — L97522 Non-pressure chronic ulcer of other part of left foot with fat layer exposed: Secondary | ICD-10-CM

## 2024-01-07 DIAGNOSIS — Z0189 Encounter for other specified special examinations: Secondary | ICD-10-CM | POA: Diagnosis not present

## 2024-01-07 NOTE — Progress Notes (Signed)
  Subjective:  Patient ID: Sheila Kerr, female    DOB: [birth year],  MRN: [id number]  Chief Complaint  Patient presents with   Wound Check    Patient states everything has been fine since last visit     DOS: 07/27/2023 Procedure: Excision of abscess and bursa  68 y.o. female returns for post-op check.    Review of Systems: Negative except as noted in the HPI. Denies N/V/F/Ch.   Objective:  There were no vitals filed for this visit. There is no height or weight on file to calculate BMI. Constitutional Well developed. Well nourished.  Vascular Foot warm and well perfused. Capillary refill normal to all digits.  Calf is soft and supple, no posterior calf or knee pain, negative Homans' sign  Neurologic Normal speech. Oriented to person, place, and time. Epicritic sensation to light touch grossly present bilaterally.  Dermatologic Residual ulceration measuring 1.5 x 2.2 x 0.4 cm with exposed subcutaneous tissue and macerated surrounding tissue central sinus with serous drainage  Orthopedic: She has little to no pain to palpation noted about the surgical site.       Assessment:   1. 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  S/p foot surgery left -Wound has deteriorated slightly.  I debrided the area and wound bed of surrounding hyperkeratosis slough and biofilm to the subcutaneous layer and excisional manner with a sharp scalpel.  Culture was taken of the serous drainage.  Hemostasis was achieved with silver nitrate and the foot was dressed with Promogran, Hydrofera Blue, Maxorb, and dry sterile dressings.  Today we continued total contact casting.  Following dressing of the wound the total contact cast was applied in a cast boot applied.  She will be weightbearing as tolerated in this.  She will return in 1 week for cast change and debridement.  We discussed that the wound continues to stagnate  and has not improved.  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  In addition the position of the hallux and short first metatarsal are likely contributing factors to her nonhealing.  We should consider further surgical options at this point.  I recommended a staged approach with sesamoidectomy excision of the wound and pinning of the hallux followed by secondary first MTP arthrodesis with bone graft 4 to 5 weeks later.  She will consider this and let me know her decision next week.  Tentatively we will be looking at March 14 as a surgical date  No follow-ups on file.

## 2024-01-10 LAB — AEROBIC CULTURE
MICRO NUMBER:: 16130964
SPECIMEN QUALITY:: ADEQUATE

## 2024-01-10 LAB — CLIENT EDUCATION TRACKING

## 2024-01-14 ENCOUNTER — Encounter: Payer: Self-pay | Admitting: Podiatry

## 2024-01-14 ENCOUNTER — Ambulatory Visit: Payer: Medicare HMO | Admitting: Podiatry

## 2024-01-14 DIAGNOSIS — M205X2 Other deformities of toe(s) (acquired), left foot: Secondary | ICD-10-CM | POA: Diagnosis not present

## 2024-01-14 DIAGNOSIS — L97522 Non-pressure chronic ulcer of other part of left foot with fat layer exposed: Secondary | ICD-10-CM | POA: Diagnosis not present

## 2024-01-14 DIAGNOSIS — M86672 Other chronic osteomyelitis, left ankle and foot: Secondary | ICD-10-CM

## 2024-01-14 NOTE — Progress Notes (Signed)
  Subjective:  Patient ID: Sheila Kerr, female    DOB: 26-Jan-1956,  MRN: [id number]  Chief Complaint  Patient presents with   Follow-up    Follow up    DOS: 07/27/2023 Procedure: Excision of abscess and bursa  68 y.o. female returns for post-op check.    Review of Systems: Negative except as noted in the HPI. Denies N/V/F/Ch.   Objective:  There were no vitals filed for this visit. There is no height or weight on file to calculate BMI. Constitutional Well developed. Well nourished.  Vascular Foot warm and well perfused. Capillary refill normal to all digits.  Calf is soft and supple, no posterior calf or knee pain, negative Homans' sign  Neurologic Normal speech. Oriented to person, place, and time. Epicritic sensation to light touch grossly present bilaterally.  Dermatologic Residual ulceration measuring 1.5 x 2.0 x 0.4 cm with exposed subcutaneous tissue and macerated surrounding tissue central sinus with less drainage today.  Surrounding maceration  Orthopedic: She has little to no pain to palpation noted about the surgical site.  She has a short first ray with hallux limitus deformity    Previous taken left foot radiographs show shortened first ray with hallux valgus deformity and hallux limitus   Assessment:   1. 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 Hallux limitus, left    Plan:  Patient was evaluated and treated and all questions answered.  S/p foot surgery left -Wound continues to stagnate.  We discussed further treatment and as we discussed at last visit we discussed surgical treatment options.  I discussed with her that I would recommend staged treatment with excision of the wound as well as the sesamoids and pinning of the toe and biopsy of the metatarsal head and proximal phalanx.  This we staged by MTP fusion with bone graft and cadaver graft 1 month later.  We discussed the  need for this due to the instability of the toe after loss of the sesamoids.  All questions addressed.  Informed consent signed reviewed.  We discussed all potential complications including not limited to  pain, swelling, infection, scar, numbness which may be temporary or permanent, chronic pain, stiffness, nerve pain or damage, wound healing problems, bone healing problems including delayed or non-union.  She understands wishes to proceed.  No debridement was necessary today.  We did apply a new total contact cast and will have it removed in 1 week prior to surgery    Surgical plan:  Procedure: -Left sesamoidectomy and pinning of toe  Location: -[facility]  Anesthesia plan: -Sedation with local  Postoperative pain plan: -Tylenol 1000 mg every 6 hours, ibuprofen 600 mg every 6 hours, gabapentin 300 mg every 8 hours x5 days, oxycodone 5 mg 1-2 tabs every 6 hours only as needed  DVT prophylaxis: -None required  WB Restrictions / DME needs: -Nonweightbearing in CAM boot postop   No follow-ups on file.

## 2024-01-15 ENCOUNTER — Telehealth: Payer: Self-pay | Admitting: Podiatry

## 2024-01-15 NOTE — Telephone Encounter (Signed)
 DOS-01/24/24  SESMOIDECTOMY [id number] EXC BENIGN LESION [id number] BONE [id number]  [employer] EFFECTIVE DATE- 11/12/21  PER THE [employer] PORTAL, PRIOR AUTH IS NOT REQUIRED FOR CPT CODES [phone]. GOOD FROM 01/24/24 - 04/23/24.  AUTH Tracking #[id number]

## 2024-01-21 ENCOUNTER — Ambulatory Visit: Admitting: Podiatry

## 2024-01-21 ENCOUNTER — Encounter: Payer: Self-pay | Admitting: Family Medicine

## 2024-01-21 ENCOUNTER — Encounter: Payer: Self-pay | Admitting: Podiatry

## 2024-01-21 ENCOUNTER — Other Ambulatory Visit: Payer: Self-pay | Admitting: Family Medicine

## 2024-01-21 DIAGNOSIS — L97522 Non-pressure chronic ulcer of other part of left foot with fat layer exposed: Secondary | ICD-10-CM

## 2024-01-21 NOTE — Progress Notes (Signed)
  Subjective:  Patient ID: Sheila Kerr, female    DOB: 07-09-1956,  MRN: [id number]  Chief Complaint  Patient presents with   Follow-up    F/u    DOS: 07/27/2023 Procedure: Excision of abscess and bursa  68 y.o. female returns for post-op check.    Review of Systems: Negative except as noted in the HPI. Denies N/V/F/Ch.   Objective:  There were no vitals filed for this visit. There is no height or weight on file to calculate BMI. Constitutional Well developed. Well nourished.  Vascular Foot warm and well perfused. Capillary refill normal to all digits.  Calf is soft and supple, no posterior calf or knee pain, negative Homans' sign  Neurologic Normal speech. Oriented to person, place, and time. Epicritic sensation to light touch grossly present bilaterally.  Dermatologic Residual ulceration measuring 1.0 x 1.2 x 0.8 cm with exposed subcutaneous tissue and minimal drainage maceration and hyperkeratosis today probes deep to the sesamoid  Orthopedic: She has little to no pain to palpation noted about the surgical site.  She has a short first ray with hallux limitus deformity     Previous taken left foot radiographs show shortened first ray with hallux valgus deformity and hallux limitus   Assessment:   1. 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  S/p foot surgery left -Ulcer stable and free of active acute signs of infection there is no active drainage.  Proceed with surgery as planned on Friday for excision of the ulcer with simple excision with sesamoidectomy versus rotation flap to cover defect.  May wash foot between now and then and use CHG scrub Friday morning before surgery    Surgical plan:  Procedure: -Left sesamoidectomy and pinning of toe, excision of ulcer versus rotation flap  Location: -[facility]  Anesthesia plan: -Sedation with local  Postoperative pain plan: -Tylenol  1000 mg every 6 hours, ibuprofen 600 mg every 6 hours, gabapentin 300 mg every 8 hours x5 days, oxycodone 5 mg 1-2 tabs every 6 hours only as needed  DVT prophylaxis: -None required  WB Restrictions / DME needs: -Nonweightbearing in CAM boot postop   Return for after surgery.

## 2024-01-21 NOTE — Telephone Encounter (Signed)
 Copied from CRM [id number]. Topic: Clinical - Medication Refill >> Jan 21, 2024  2:45 PM Clayton Bibles wrote: Most Recent Primary Care Visit:  Provider: Kurtis Bushman S  Department: [facility]  Visit Type: NEW PT - OFFICE VISIT  Date: 11/05/2023  Medication: lisinopril (ZESTRIL) 20 MG tablet  Has the patient contacted their pharmacy? No (Agent: If no, request that the patient contact the pharmacy for the refill. If patient does not wish to contact the pharmacy document the reason why and proceed with request.) (Agent: If yes, when and what did the pharmacy advise?)  Is this the correct pharmacy for this prescription? Yes - [facility]  If no, delete pharmacy and type the correct one.  This is the patient's preferred pharmacy:  [facility] [address], [place] - [address] [place] [address] [place] [address] Phone: [phone] Fax: [phone]   Has the prescription been filled recently? No  Is the patient out of the medication? No - She has enough for 20 days  Has the patient been seen for an appointment in the last year OR does the patient have an upcoming appointment? Yes  Can we respond through MyChart? Yes - Please send a message through MyChart if needed  Agent: Please be advised that Rx refills may take up to 3 business days. We ask that you follow-up with your pharmacy.

## 2024-01-22 MED ORDER — LISINOPRIL 20 MG PO TABS: 20.0000 mg | ORAL_TABLET | Freq: Every day | ORAL | 0 refills | Status: DC

## 2024-01-24 ENCOUNTER — Other Ambulatory Visit: Payer: Self-pay | Admitting: Podiatry

## 2024-01-24 DIAGNOSIS — M899 Disorder of bone, unspecified: Secondary | ICD-10-CM | POA: Diagnosis not present

## 2024-01-24 DIAGNOSIS — M86472 Chronic osteomyelitis with draining sinus, left ankle and foot: Secondary | ICD-10-CM | POA: Diagnosis not present

## 2024-01-24 DIAGNOSIS — M84872 Other disorders of continuity of bone, left ankle and foot: Secondary | ICD-10-CM | POA: Diagnosis not present

## 2024-01-24 DIAGNOSIS — L97522 Non-pressure chronic ulcer of other part of left foot with fat layer exposed: Secondary | ICD-10-CM | POA: Diagnosis not present

## 2024-01-24 DIAGNOSIS — L97424 Non-pressure chronic ulcer of left heel and midfoot with necrosis of bone: Secondary | ICD-10-CM | POA: Diagnosis not present

## 2024-01-24 MED ORDER — DOXYCYCLINE HYCLATE 100 MG PO TABS: 100.0000 mg | ORAL_TABLET | Freq: Two times a day (BID) | ORAL | 0 refills | Status: DC

## 2024-01-24 MED ORDER — ACETAMINOPHEN 500 MG PO TABS: 1000.0000 mg | ORAL_TABLET | Freq: Four times a day (QID) | ORAL | 0 refills | Status: AC | PRN

## 2024-01-24 MED ORDER — TRAMADOL HCL 50 MG PO TABS: 50.0000 mg | ORAL_TABLET | Freq: Four times a day (QID) | ORAL | 0 refills | Status: AC | PRN

## 2024-01-24 MED ORDER — IBUPROFEN 600 MG PO TABS: 600.0000 mg | ORAL_TABLET | Freq: Four times a day (QID) | ORAL | 0 refills | Status: AC | PRN

## 2024-01-24 NOTE — Progress Notes (Signed)
 3/14 sesamoidectomy, wound excision

## 2024-01-28 ENCOUNTER — Other Ambulatory Visit: Payer: Self-pay | Admitting: Podiatry

## 2024-01-30 ENCOUNTER — Ambulatory Visit (INDEPENDENT_AMBULATORY_CARE_PROVIDER_SITE_OTHER)

## 2024-01-30 ENCOUNTER — Ambulatory Visit (INDEPENDENT_AMBULATORY_CARE_PROVIDER_SITE_OTHER): Admitting: Podiatry

## 2024-01-30 ENCOUNTER — Encounter: Payer: Self-pay | Admitting: Podiatry

## 2024-01-30 ENCOUNTER — Telehealth: Payer: Self-pay | Admitting: Podiatry

## 2024-01-30 DIAGNOSIS — M86472 Chronic osteomyelitis with draining sinus, left ankle and foot: Secondary | ICD-10-CM

## 2024-01-30 DIAGNOSIS — M21962 Unspecified acquired deformity of left lower leg: Secondary | ICD-10-CM | POA: Diagnosis not present

## 2024-01-30 MED ORDER — LEVOFLOXACIN 750 MG PO TABS: 750.0000 mg | ORAL_TABLET | Freq: Every day | ORAL | 0 refills | Status: DC

## 2024-01-30 NOTE — Patient Instructions (Signed)
 [facility]: Address: [address], [place], [place] [address]  Phone: [phone]

## 2024-01-30 NOTE — Telephone Encounter (Signed)
 Patient has questions on how to take medication prescribed by Dr. Lilian Kapur. Please contact patient [phone]

## 2024-02-02 NOTE — Progress Notes (Signed)
  Subjective:  Patient ID: Sheila Kerr, female    DOB: 1956-03-08,  MRN: [id number]  Chief Complaint  Patient presents with   Routine Post Op    POV #1 DOS 01/24/24 SESMOID EXCISIONAND PINNINGOF TOE LEFT FOOT "I guess it's alright, it has not hurt a whole lot.  My pain level is a two."    DOS: 01/24/2024 Procedure: Excision of sesamoids, rotation flap, pinning of toe  68 y.o. female returns for post-op check.   Review of Systems: Negative except as noted in the HPI. Denies N/V/F/Ch.   Objective:  There were no vitals filed for this visit. There is no height or weight on file to calculate BMI. Constitutional Well developed. Well nourished.  Vascular Foot warm and well perfused. Capillary refill normal to all digits.  Calf is soft and supple, no posterior calf or knee pain, negative Homans' sign  Neurologic Normal speech. Oriented to person, place, and time. Epicritic sensation to light touch grossly present bilaterally.  Dermatologic Skin healing well without signs of infection. Skin edges well coapted without signs of infection.  Mild maceration  Orthopedic: Tenderness to palpation noted about the surgical site.   Pathology still pending  Culture results grew staph lugdunensis in the deep culture swab, Klebsiella in the sesamoids, Enterobacter cloacae in the proximal phalanx and no growth on the metatarsal  Multiple view plain film radiographs: Adequate position of wire and interval removal of sesamoids Assessment:   1. Deformity of metatarsal bone of left foot   2. 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  S/p foot surgery left -Progressing as expected post-operatively.  Seems to be healing well.  Minor maceration.  Change every other day with Betadine.  Unfortunately has positive cultures of bone.  I do recommend treating this in advance of possible arthrodesis.  Referral  placed for infectious disease consultation, we will upload reports of the sensitivities and pathology report when finalized.  Continue nonweightbearing.  Return in 2 weeks to have sutures removed.   No follow-ups on file.

## 2024-02-03 ENCOUNTER — Encounter: Payer: Self-pay | Admitting: Podiatry

## 2024-02-13 ENCOUNTER — Ambulatory Visit (INDEPENDENT_AMBULATORY_CARE_PROVIDER_SITE_OTHER): Admitting: Podiatry

## 2024-02-13 ENCOUNTER — Encounter: Payer: Self-pay | Admitting: Podiatry

## 2024-02-13 DIAGNOSIS — M86472 Chronic osteomyelitis with draining sinus, left ankle and foot: Secondary | ICD-10-CM

## 2024-02-13 DIAGNOSIS — L97522 Non-pressure chronic ulcer of other part of left foot with fat layer exposed: Secondary | ICD-10-CM

## 2024-02-13 MED ORDER — GENTAMICIN SULFATE 0.1 % EX OINT: 1.0000 | TOPICAL_OINTMENT | Freq: Every day | CUTANEOUS | 0 refills | Status: DC

## 2024-02-13 MED ORDER — LEVOFLOXACIN 750 MG PO TABS: 750.0000 mg | ORAL_TABLET | Freq: Every day | ORAL | 0 refills | Status: DC

## 2024-02-13 NOTE — Progress Notes (Signed)
  Subjective:  Patient ID: Sheila Kerr, female    DOB: 1956/06/20,  MRN: [id number]  No chief complaint on file.   DOS: 01/24/2024 Procedure: Excision of sesamoids, rotation flap, pinning of toe  68 y.o. female returns for post-op check.   Review of Systems: Negative except as noted in the HPI. Denies N/V/F/Ch.   Objective:  There were no vitals filed for this visit. There is no height or weight on file to calculate BMI. Constitutional Well developed. Well nourished.  Vascular Foot warm and well perfused. Capillary refill normal to all digits.  Calf is soft and supple, no posterior calf or knee pain, negative Homans' sign  Neurologic Normal speech. Oriented to person, place, and time. Epicritic sensation to light touch grossly present bilaterally.  Dermatologic Skin healing well without signs of infection.  Maceration has resolved.  The distal apex of the rotation flap does show superficial necrosis but no deep dehiscence  Orthopedic: Tenderness to palpation noted about the surgical site.     Pathology results showed negative for inflammation and atypia   Culture results grew staph lugdunensis in the deep culture swab, Klebsiella in the sesamoids, Enterobacter cloacae in the proximal phalanx and no growth on the metatarsal  Multiple view plain film radiographs: Adequate position of wire and interval removal of sesamoids  Assessment:   1. 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  S/p foot surgery left -Has some superficial delayed healing at the apex of the rotation flap site.  Start local wound care with gentamicin ointment Rx sent to pharmacy.  Continue Levaquin for now.  She follows up with ID on Monday.  Return in 1 week to reevaluate and debride wound.  Return in about 1 week (around 02/20/2024) for wound care.

## 2024-02-14 ENCOUNTER — Telehealth: Payer: Self-pay | Admitting: Podiatry

## 2024-02-14 NOTE — Telephone Encounter (Signed)
 DOS: 03/06/24  LEFT HALLUX MPJ FUSION [address] BONE GRAFT LEFT [address]    EFFECTIVE DATE: 11/12/2021   CO INSURANCE : 0%   PER THE [employer] WEBSITE NO PRIOR AUTH IS REQ FOR CPT CODES [id number]  REF# [id number]

## 2024-02-17 ENCOUNTER — Encounter: Payer: Self-pay | Admitting: Internal Medicine

## 2024-02-17 ENCOUNTER — Other Ambulatory Visit: Payer: Self-pay

## 2024-02-17 ENCOUNTER — Ambulatory Visit: Admitting: Internal Medicine

## 2024-02-17 VITALS — Resp 16 | Ht 65.0 in | Wt 221.0 lb

## 2024-02-17 DIAGNOSIS — M869 Osteomyelitis, unspecified: Secondary | ICD-10-CM

## 2024-02-17 MED ORDER — LEVOFLOXACIN 750 MG PO TABS: 750.0000 mg | ORAL_TABLET | Freq: Every day | ORAL | 0 refills | Status: DC

## 2024-02-17 NOTE — Progress Notes (Unsigned)
 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 F with PMHx as below presents for referral for left foot wound.  Patient had gone to the OR on 3/14 with podiatry for sesamoid excision and peeling of the left foot toe.  It was noted that cultures grew staph lugdunensis and deep culture swab, Klebsiella and sesamoid, Enterobacter proximal phalanx with no growth in metatarsal.  Patient was started on Levaquin on/3 and given infectious disease follow-up for further management. Today: Patient states she is tolerating antibiotics overall.  She has had only 2 bouts of diarrhea in the past week or so.  No diarrhea today.  She is not taking probiotics.  Review of Systems  All other systems reviewed and are negative.   Past Medical History:  Diagnosis Date   Allergy    Asthma    GERD (gastroesophageal reflux disease)    Hyperlipidemia    Hypertension    Thyroid disease     Outpatient Medications Prior to Visit  Medication Sig Dispense Refill   albuterol (VENTOLIN HFA) 108 (90 Base) MCG/ACT inhaler      ascorbic acid (VITAMIN C) 500 MG tablet Take 500 mg by mouth daily.     Cholecalciferol (VITAMIN D3) 50 MCG (2000 UT) CHEW Chew by mouth.     Collagen-Vitamin C-Biotin (COLLAGEN PO) Take 600 mg by mouth.     diclofenac (VOLTAREN) 50 MG EC tablet Take 50 mg by mouth 2 (two) times daily.     ezetimibe (ZETIA) 10 MG tablet      FLUTICASONE PROPIONATE HFA IN Inhale into the lungs.     gentamicin ointment  (GARAMYCIN) 0.1 % Apply 1 Application topically daily. Apply to wound daily 15 g 0   hydrochlorothiazide (HYDRODIURIL) 12.5 MG tablet      Lactobacillus (PROBIOTIC ACIDOPHILUS PO) Take by mouth.     levofloxacin (LEVAQUIN) 750 MG tablet Take 1 tablet (750 mg total) by mouth daily. 7 tablet 0   levothyroxine (SYNTHROID) 100 MCG tablet      lisinopril (ZESTRIL) 20 MG tablet Take 1 tablet (20 mg total) by mouth daily. 30 tablet 0   Multiple Vitamins-Minerals (ALIVE DIABETIC MULTIVITAMIN PO) Take by mouth.     omeprazole (PRILOSEC) 40 MG capsule SMARTSIG:1 Capsule(s) By Mouth Every Evening     OVER THE COUNTER MEDICATION Pt takes ALLERTEC     doxycycline (VIBRA-TABS) 100 MG tablet Take 1 tablet (100 mg total) by mouth 2 (two) times daily. (Patient not taking: Reported on 02/17/2024) 28 tablet 0   No facility-administered medications prior to visit.     Allergies  Allergen Reactions   Amoxicillin-Pot Clavulanate Nausea And Vomiting   Adhesive [Tape]    Latex Rash    Social History   Tobacco Use   Smoking status: Never   Smokeless tobacco: Never  Substance Use Topics   Alcohol use: Not Currently   Drug use: Never    History reviewed. No pertinent family history.  Objective:   Vitals:   02/17/24 1250  Resp: 16  Weight: 221 lb (100.2 kg)  Height: 5\' 5"  (1.651 m)   Body mass index is 36.78 kg/m.  Physical Exam Constitutional:      Appearance: Normal appearance.  HENT:     Head: Normocephalic and atraumatic.     Right Ear: Tympanic membrane normal.     Left Ear: Tympanic membrane normal.     Nose: Nose normal.     Mouth/Throat:     Mouth: Mucous membranes are moist.  Eyes:     Extraocular Movements: Extraocular movements intact.     Conjunctiva/sclera: Conjunctivae normal.     Pupils: Pupils are equal, round, and reactive to light.  Cardiovascular:     Rate and Rhythm: Normal rate and regular rhythm.     Heart sounds: No murmur heard.    No friction rub. No gallop.   Pulmonary:     Effort: Pulmonary effort is normal.     Breath sounds: Normal breath sounds.  Abdominal:     General: Abdomen is flat.     Palpations: Abdomen is soft.  Skin:    General: Skin is warm and dry.  Neurological:     General: No focal deficit present.     Mental Status: She is alert and oriented to person, place, and time.  Psychiatric:        Mood and Affect: Mood normal.     Lab Results: Lab Results  Component Value Date   WBC 7.1 07/04/2023   HGB 12.3 07/04/2023   HCT 37.7 07/04/2023   MCV 90 07/04/2023   PLT 238 07/04/2023   No results found for: "CREATININE", "BUN", "NA", "K", "CL", "CO2" No results found for: "ALT", "AST", "GGT", "ALKPHOS", "BILITOT"       Assessment & Plan:   # 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 - Taken to the OR 3/14 for excision sesamoid, rotation flap and pinning of toe.  Noted that deep culture swab grew staph lugdunensis.  Klebsiella and sesamoid.  Enterobacter cloacae in proximal phalanx, metatarsal with no growth. - Patient has been on Levaquin since 4/3.  H she is overall tolerating it.  On probiotics now. - I spoke to Dr. Diona Browner today.  Will plan on 6 weeks antibiotics with Levaquin.  EOT 5/14.  She will need to be on suppressive antibiotic as long as the pin is in place after completion of regimen. - Possible fusion, would recommend that at the end of treatment.  Would recommend biopsy prior to having fusion to make sure cultures are clean.  I think ideally this would have to be done off antibiotic s/p and would have to be removed for about a week.  If patient is on antibiotics while cultures are taken we will need to get MRI as well. Then ulcer big toe-> 3/20 sesamoid exceison bx 1st metatrsal -EKG showed QTc of 384. - Follow-up with ID on 5/1.  Labs today.  Danelle Earthly, MD [facility]   02/17/24  1:17 PM I have personally spent 60  minutes involved in face-to-face and non-face-to-face activities for this patient on the day of the visit. Professional time spent includes the following activities: Preparing to see the patient (review of tests), Obtaining and/or reviewing separately obtained history (admission/discharge record), Performing a medically appropriate examination and/or evaluation , Ordering medications/tests/procedures, referring and communicating with other health care professionals, Documenting clinical information in the EMR,  Independently interpreting results (not separately reported), Communicating results to the patient/family/caregiver, Counseling and educating the patient/family/caregiver and Care coordination (not separately reported).

## 2024-02-18 LAB — COMPLETE METABOLIC PANEL WITHOUT GFR
AG Ratio: 1.5 (calc) (ref 1.0–2.5)
ALT: 13 U/L (ref 6–29)
AST: 15 U/L (ref 10–35)
Albumin: 4.4 g/dL (ref 3.6–5.1)
Alkaline phosphatase (APISO): 78 U/L (ref 37–153)
BUN/Creatinine Ratio: 26 (calc) — ABNORMAL HIGH (ref 6–22)
BUN: 29 mg/dL — ABNORMAL HIGH (ref 7–25)
CO2: 25 mmol/L (ref 20–32)
Calcium: 9.8 mg/dL (ref 8.6–10.4)
Chloride: 104 mmol/L (ref 98–110)
Creat: 1.12 mg/dL — ABNORMAL HIGH (ref 0.50–1.05)
Globulin: 3 g/dL (ref 1.9–3.7)
Glucose, Bld: 97 mg/dL (ref 65–99)
Potassium: 3.8 mmol/L (ref 3.5–5.3)
Sodium: 140 mmol/L (ref 135–146)
Total Bilirubin: 0.4 mg/dL (ref 0.2–1.2)
Total Protein: 7.4 g/dL (ref 6.1–8.1)

## 2024-02-18 LAB — CBC WITH DIFFERENTIAL/PLATELET
Absolute Lymphocytes: 1304 {cells}/uL (ref 850–3900)
Absolute Monocytes: 449 {cells}/uL (ref 200–950)
Basophils Absolute: 48 {cells}/uL (ref 0–200)
Basophils Relative: 0.7 %
Eosinophils Absolute: 179 {cells}/uL (ref 15–500)
Eosinophils Relative: 2.6 %
HCT: 40.6 % (ref 35.0–45.0)
Hemoglobin: 13.1 g/dL (ref 11.7–15.5)
MCH: 28.7 pg (ref 27.0–33.0)
MCHC: 32.3 g/dL (ref 32.0–36.0)
MCV: 88.8 fL (ref 80.0–100.0)
MPV: 11.3 fL (ref 7.5–12.5)
Monocytes Relative: 6.5 %
Neutro Abs: 4920 {cells}/uL (ref 1500–7800)
Neutrophils Relative %: 71.3 %
Platelets: 207 10*3/uL (ref 140–400)
RBC: 4.57 10*6/uL (ref 3.80–5.10)
RDW: 14.1 % (ref 11.0–15.0)
Total Lymphocyte: 18.9 %
WBC: 6.9 10*3/uL (ref 3.8–10.8)

## 2024-02-18 LAB — SEDIMENTATION RATE: Sed Rate: 28 mm/h (ref 0–30)

## 2024-02-18 LAB — C-REACTIVE PROTEIN: CRP: <3 mg/L (ref <8.0)

## 2024-02-20 ENCOUNTER — Encounter: Payer: Self-pay | Admitting: Podiatry

## 2024-02-20 ENCOUNTER — Ambulatory Visit (INDEPENDENT_AMBULATORY_CARE_PROVIDER_SITE_OTHER): Admitting: Podiatry

## 2024-02-20 DIAGNOSIS — M86472 Chronic osteomyelitis with draining sinus, left ankle and foot: Secondary | ICD-10-CM

## 2024-02-20 DIAGNOSIS — L97522 Non-pressure chronic ulcer of other part of left foot with fat layer exposed: Secondary | ICD-10-CM

## 2024-02-23 NOTE — Progress Notes (Signed)
  Subjective:  Patient ID: Sheila Kerr, female    DOB: 1955/11/28,  MRN: [id number]  Chief Complaint  Patient presents with   Follow-up    Patient states everything has been going good since last visit    DOS: 01/24/2024 Procedure: Excision of sesamoids, rotation flap, pinning of toe  68 y.o. female returns for post-op check.   Review of Systems: Negative except as noted in the HPI. Denies N/V/F/Ch.   Objective:  There were no vitals filed for this visit. There is no height or weight on file to calculate BMI. Constitutional Well developed. Well nourished.  Vascular Foot warm and well perfused. Capillary refill normal to all digits.  Calf is soft and supple, no posterior calf or knee pain, negative Homans' sign  Neurologic Normal speech. Oriented to person, place, and time. Epicritic sensation to light touch grossly present bilaterally.  Dermatologic Majority of incision is well-healed with the distal apex of the rotation flap shows superficial ulceration with exposed subcutaneous tissue  Orthopedic: Tenderness to palpation noted about the surgical site.     Pathology results showed negative for inflammation and atypia   Culture results grew staph lugdunensis in the deep culture swab, Klebsiella in the sesamoids, Enterobacter cloacae in the proximal phalanx and no growth on the metatarsal  Multiple view plain film radiographs: Adequate position of wire and interval removal of sesamoids  Assessment:   1. 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  S/p foot surgery left - Superficial ulceration portions debrided.  Is going to be on Levaquin for 6 weeks total.  Her upcoming second stage surgical date I recommend that we rebiopsy the distal metatarsal and proximal phalanx.  We will remove the wire at that time.  Plan for third  stage following that not long after with MTP fusion.   No follow-ups on file.

## 2024-02-24 ENCOUNTER — Telehealth: Payer: Self-pay | Admitting: Urology

## 2024-02-24 MED ORDER — LEVOFLOXACIN 750 MG PO TABS: 750.0000 mg | ORAL_TABLET | Freq: Every day | ORAL | 0 refills | Status: DC

## 2024-02-24 NOTE — Telephone Encounter (Signed)
 Pt has 3 abx pills left, pt wants to know if you will be refilling them.   [id number] - [place], [place] - [address]

## 2024-02-24 NOTE — Addendum Note (Signed)
 Addended byMichalene Agee, Jamine Highfill R on: 02/24/2024 04:56 PM   Modules accepted: Orders

## 2024-02-26 ENCOUNTER — Other Ambulatory Visit: Payer: Self-pay

## 2024-02-26 DIAGNOSIS — H5213 Myopia, bilateral: Secondary | ICD-10-CM | POA: Diagnosis not present

## 2024-02-26 DIAGNOSIS — H524 Presbyopia: Secondary | ICD-10-CM | POA: Diagnosis not present

## 2024-02-26 DIAGNOSIS — Z135 Encounter for screening for eye and ear disorders: Secondary | ICD-10-CM | POA: Diagnosis not present

## 2024-02-26 DIAGNOSIS — H40013 Open angle with borderline findings, low risk, bilateral: Secondary | ICD-10-CM | POA: Diagnosis not present

## 2024-02-26 DIAGNOSIS — L97522 Non-pressure chronic ulcer of other part of left foot with fat layer exposed: Secondary | ICD-10-CM

## 2024-02-27 ENCOUNTER — Other Ambulatory Visit: Payer: Medicare HMO

## 2024-02-27 ENCOUNTER — Other Ambulatory Visit: Payer: Self-pay | Admitting: Internal Medicine

## 2024-02-27 DIAGNOSIS — E782 Mixed hyperlipidemia: Secondary | ICD-10-CM | POA: Diagnosis not present

## 2024-02-27 DIAGNOSIS — I10 Essential (primary) hypertension: Secondary | ICD-10-CM

## 2024-02-27 DIAGNOSIS — E039 Hypothyroidism, unspecified: Secondary | ICD-10-CM

## 2024-02-28 LAB — CBC WITH DIFFERENTIAL/PLATELET
Absolute Lymphocytes: 1457 {cells}/uL (ref 850–3900)
Absolute Monocytes: 484 {cells}/uL (ref 200–950)
Basophils Absolute: 31 {cells}/uL (ref 0–200)
Basophils Relative: 0.5 %
Eosinophils Absolute: 223 {cells}/uL (ref 15–500)
Eosinophils Relative: 3.6 %
HCT: 37.7 % (ref 35.0–45.0)
Hemoglobin: 12.3 g/dL (ref 11.7–15.5)
MCH: 29.1 pg (ref 27.0–33.0)
MCHC: 32.6 g/dL (ref 32.0–36.0)
MCV: 89.3 fL (ref 80.0–100.0)
MPV: 11.2 fL (ref 7.5–12.5)
Monocytes Relative: 7.8 %
Neutro Abs: 4005 {cells}/uL (ref 1500–7800)
Neutrophils Relative %: 64.6 %
Platelets: 177 10*3/uL (ref 140–400)
RBC: 4.22 10*6/uL (ref 3.80–5.10)
RDW: 14.4 % (ref 11.0–15.0)
Total Lymphocyte: 23.5 %
WBC: 6.2 10*3/uL (ref 3.8–10.8)

## 2024-02-28 LAB — COMPLETE METABOLIC PANEL WITHOUT GFR
AG Ratio: 1.6 (calc) (ref 1.0–2.5)
ALT: 11 U/L (ref 6–29)
AST: 13 U/L (ref 10–35)
Albumin: 4.2 g/dL (ref 3.6–5.1)
Alkaline phosphatase (APISO): 69 U/L (ref 37–153)
BUN/Creatinine Ratio: 27 (calc) — ABNORMAL HIGH (ref 6–22)
BUN: 29 mg/dL — ABNORMAL HIGH (ref 7–25)
CO2: 27 mmol/L (ref 20–32)
Calcium: 9.9 mg/dL (ref 8.6–10.4)
Chloride: 103 mmol/L (ref 98–110)
Creat: 1.06 mg/dL — ABNORMAL HIGH (ref 0.50–1.05)
Globulin: 2.7 g/dL (ref 1.9–3.7)
Glucose, Bld: 82 mg/dL (ref 65–99)
Potassium: 3.7 mmol/L (ref 3.5–5.3)
Sodium: 140 mmol/L (ref 135–146)
Total Bilirubin: 0.4 mg/dL (ref 0.2–1.2)
Total Protein: 6.9 g/dL (ref 6.1–8.1)

## 2024-02-28 LAB — LIPID PANEL
Cholesterol: 179 mg/dL (ref <200)
HDL: 47 mg/dL — ABNORMAL LOW (ref >=50)
LDL Cholesterol (Calc): 98 mg/dL
Non-HDL Cholesterol (Calc): 132 mg/dL — ABNORMAL HIGH (ref <130)
Total CHOL/HDL Ratio: 3.8 (calc) (ref <5.0)
Triglycerides: 226 mg/dL — ABNORMAL HIGH (ref <150)

## 2024-02-28 LAB — TSH: TSH: 4.94 m[IU]/L — ABNORMAL HIGH (ref 0.40–4.50)

## 2024-03-02 ENCOUNTER — Other Ambulatory Visit: Payer: Self-pay

## 2024-03-02 NOTE — Telephone Encounter (Signed)
 Prescription Request  03/02/2024  LOV: 11/05/23 UPCOMING CPE 03/05/24  What is the name of the medication or equipment? lisinopril  (ZESTRIL ) 20 MG tablet [[id number]]   Have you contacted your pharmacy to request a refill? Yes   Which pharmacy would you like this sent to?  [facility] [address], [place] - [address] [place] [address] Phone: [phone] Fax: [phone]    Patient notified that their request is being sent to the clinical staff for review and that they should receive a response within 2 business days.   Please advise at [facility] [phone]

## 2024-03-03 MED ORDER — LISINOPRIL 20 MG PO TABS: 20.0000 mg | ORAL_TABLET | Freq: Every day | ORAL | 0 refills | Status: DC

## 2024-03-03 NOTE — Telephone Encounter (Signed)
 Requested Prescriptions  Pending Prescriptions Disp Refills   lisinopril  (ZESTRIL ) 20 MG tablet 90 tablet 0    Sig: Take 1 tablet (20 mg total) by mouth daily.     Cardiovascular:  ACE Inhibitors Failed - 03/03/2024  8:33 AM      Failed - Cr in normal range and within 180 days    Creat  Date Value Ref Range Status  02/27/2024 1.06 (H) 0.50 - 1.05 mg/dL Final   Creatinine, POC  Date Value Ref Range Status  02/25/2023 66.7 mg/dL Final    Comment:    Abstracted by HIM         Failed - Last BP in normal range    BP Readings from Last 1 Encounters:  11/05/23 (!) 150/88         Failed - Valid encounter within last 6 months    Recent Outpatient Visits           3 months ago Primary hypertension   [place] [facility] [name], FNP       Future Appointments             In 3 weeks [name], MD [facility]            Passed - K in normal range and within 180 days    Potassium  Date Value Ref Range Status  02/27/2024 3.7 3.5 - 5.3 mmol/L Final         Passed - Patient is not pregnant

## 2024-03-05 ENCOUNTER — Ambulatory Visit: Payer: Medicare HMO | Admitting: Family Medicine

## 2024-03-05 ENCOUNTER — Encounter: Admitting: Podiatry

## 2024-03-05 ENCOUNTER — Encounter: Payer: Self-pay | Admitting: Family Medicine

## 2024-03-05 VITALS — BP 118/78 | HR 84 | Resp 16 | Ht 65.0 in

## 2024-03-05 DIAGNOSIS — Z0000 Encounter for general adult medical examination without abnormal findings: Secondary | ICD-10-CM | POA: Insufficient documentation

## 2024-03-05 DIAGNOSIS — Z0001 Encounter for general adult medical examination with abnormal findings: Secondary | ICD-10-CM | POA: Diagnosis not present

## 2024-03-05 DIAGNOSIS — I10 Essential (primary) hypertension: Secondary | ICD-10-CM

## 2024-03-05 DIAGNOSIS — K219 Gastro-esophageal reflux disease without esophagitis: Secondary | ICD-10-CM | POA: Diagnosis not present

## 2024-03-05 DIAGNOSIS — E782 Mixed hyperlipidemia: Secondary | ICD-10-CM | POA: Diagnosis not present

## 2024-03-05 DIAGNOSIS — E039 Hypothyroidism, unspecified: Secondary | ICD-10-CM

## 2024-03-05 MED ORDER — LEVOTHYROXINE SODIUM 112 MCG PO TABS: 112.0000 ug | ORAL_TABLET | Freq: Every day | ORAL | 1 refills | Status: DC

## 2024-03-05 MED ORDER — LISINOPRIL 20 MG PO TABS: 20.0000 mg | ORAL_TABLET | Freq: Every day | ORAL | 0 refills | Status: DC

## 2024-03-05 NOTE — Assessment & Plan Note (Signed)
 Intolerant of statins, continue Zetia 10mg  daily. Your labs showed elevated cholesterol. I recommend consuming a heart healthy diet such as Mediterranean diet or DASH diet with whole grains, fruits, vegetable, fish, lean meats, nuts, and olive oil. Limit sweets and processed foods. I also encourage moderate intensity exercise 150 minutes weekly. This is 3-5 times weekly for 30-50 minutes each session. Goal should be pace of 3 miles/hours, or walking 1.5 miles in 30 minutes. The 10-year ASCVD risk score (Arnett DK, et al., 2019) is: 8.8%

## 2024-03-05 NOTE — Assessment & Plan Note (Signed)
 Chronic, well controlled. Continue Lisinopril  and hydrochlorothiazide. Recommend heart healthy diet such as Mediterranean diet with whole grains, fruits, vegetable, fish, lean meats, nuts, and olive oil. Limit salt. Encouraged moderate walking, 3-5 times/week for 30-50 minutes each session. Aim for at least 150 minutes.week. Goal should be pace of 3 miles/hours, or walking 1.5 miles in 30 minutes. Avoid tobacco products. Avoid excess alcohol. Take medications as prescribed and bring medications and blood pressure log with cuff to each office visit. Seek medical care for chest pain, palpitations, shortness of breath with exertion, dizziness/lightheadedness, vision changes, recurrent headaches, or swelling of extremities. Follow up in 6 months

## 2024-03-05 NOTE — Progress Notes (Signed)
 Complete physical exam  Patient: Sheila Kerr   DOB: Mar 26, 1956   68 y.o. Female  MRN: [id number]  Subjective:    Chief Complaint  Patient presents with   Annual Exam    Sheila Kerr is a 68 y.o. female who presents today for a complete physical exam. She reports consuming a general diet. Exercise is limited by orthopedic condition(s): left foot osteo. She generally feels fairly well. She reports sleeping fairly well. She does not have additional problems to discuss today.    HYPOTHYROIDISM Thyroid  control status:uncontrolled Satisfied with current treatment? yes Medication side effects: no Medication compliance: excellent compliance Etiology of hypothyroidism:  Recent dose adjustment:no Fatigue: yes Cold intolerance: no Heat intolerance: no Weight gain: no Weight loss: no Constipation: no Diarrhea/loose stools: no Palpitations: no Lower extremity edema: no Anxiety/depressed mood: no  HYPERTENSION / HYPERLIPIDEMIA Satisfied with current treatment? yes Duration of hypertension: chronic BP monitoring frequency: rarely BP range:  BP medication side effects: no Past BP meds: hydrochlorothiazide, lisinopril  Duration of hyperlipidemia: chronic Cholesterol medication side effects: no Cholesterol supplements: none Past cholesterol medications: zetia, cough with statin Medication compliance: excellent compliance Aspirin: no Recent stressors: yes Recurrent headaches: no Visual changes: no Palpitations: no Dyspnea: no Chest pain: no Lower extremity edema: no Dizzy/lightheaded: no  GERD GERD control status: controlled Satisfied with current treatment? yes Heartburn frequency:  Medication side effects: no  Medication compliance: stable Dysphagia: no Odynophagia:  no Hematemesis: no Blood in stool: no EGD: no   Most recent fall risk assessment:    03/05/2024    9:47 AM  Fall Risk   Falls in the past year? 0  Number falls in past yr: 0  Injury  with Fall? 0  Risk for fall due to : No Fall Risks  Follow up Falls evaluation completed;Falls prevention discussed     Most recent depression screenings:    03/05/2024    9:47 AM 02/17/2024   12:58 PM  PHQ 2/9 Scores  PHQ - 2 Score 0 0  PHQ- 9 Score 0     Vision:Within last year and Dental: No current dental problems and Receives regular dental care  Patient Active Problem List   Diagnosis Date Noted   Physical exam, annual 03/05/2024   Gastroesophageal reflux disease without esophagitis 03/05/2024   Murmur 11/11/2023   Hypothyroidism 11/11/2023   Morbid obesity (HCC) 11/11/2023   Primary hypertension 11/11/2023   Mixed hyperlipidemia 11/11/2023   Tubulovillous adenoma of colon 09/06/2016   Obesity, morbid (more than 100 lbs over ideal weight or BMI > 40) (HCC) 12/31/2011   Past Medical History:  Diagnosis Date   Allergy    Asthma    GERD (gastroesophageal reflux disease)    Hyperlipidemia    Hypertension    Thyroid  disease    Past Surgical History:  Procedure Laterality Date   ABDOMINAL HYSTERECTOMY     FOOT SURGERY  08/26/2023   both foot one in 2023   Social History   Tobacco Use   Smoking status: Never   Smokeless tobacco: Never  Substance Use Topics   Alcohol use: Not Currently   Drug use: Never   No family history on file. Allergies  Allergen Reactions   Amoxicillin -Pot Clavulanate Nausea And Vomiting   Adhesive [Tape]    Latex Rash      Patient Care Team: [name], FNP as PCP - General (Family Medicine)   Outpatient Medications Prior to Visit  Medication Sig   albuterol (VENTOLIN HFA) 108 (  90 Base) MCG/ACT inhaler    ascorbic acid  (VITAMIN C) 500 MG tablet Take 500 mg by mouth daily.   Cholecalciferol (VITAMIN D3) 50 MCG (2000 UT) CHEW Chew by mouth.   Collagen-Vitamin C-Biotin (COLLAGEN PO) Take 600 mg by mouth.   diclofenac (VOLTAREN) 50 MG EC tablet Take 50 mg by mouth 2 (two) times daily.   doxycycline  (VIBRA -TABS) 100 MG tablet  Take 1 tablet (100 mg total) by mouth 2 (two) times daily. (Patient not taking: Reported on 03/05/2024)   ezetimibe (ZETIA) 10 MG tablet    FLUTICASONE PROPIONATE HFA IN Inhale into the lungs.   gentamicin  ointment (GARAMYCIN ) 0.1 % Apply 1 Application topically daily. Apply to wound daily   hydrochlorothiazide (HYDRODIURIL) 12.5 MG tablet    Lactobacillus (PROBIOTIC ACIDOPHILUS PO) Take by mouth.   levofloxacin  (LEVAQUIN ) 750 MG tablet Take 1 tablet (750 mg total) by mouth daily.   Multiple Vitamins-Minerals (ALIVE DIABETIC MULTIVITAMIN PO) Take by mouth.   omeprazole (PRILOSEC) 40 MG capsule SMARTSIG:1 Capsule(s) By Mouth Every Evening   OVER THE COUNTER MEDICATION Pt takes ALLERTEC   [DISCONTINUED] levothyroxine  (SYNTHROID ) 100 MCG tablet    [DISCONTINUED] lisinopril  (ZESTRIL ) 20 MG tablet Take 1 tablet (20 mg total) by mouth daily.   No facility-administered medications prior to visit.    Review of Systems  Constitutional: Negative.   HENT: Negative.    Eyes: Negative.   Respiratory: Negative.    Cardiovascular: Negative.   Gastrointestinal: Negative.   Genitourinary: Negative.   Musculoskeletal: Negative.   Skin: Negative.   Neurological: Negative.   Endo/Heme/Allergies: Negative.   Psychiatric/Behavioral: Negative.    All other systems reviewed and are negative.         Objective:     BP 118/78   Pulse 84   Resp 16   Ht 5\' 5"  (1.651 m)   SpO2 99%   BMI 36.78 kg/m  BP Readings from Last 3 Encounters:  03/05/24 118/78  11/05/23 (!) 150/88  12/28/21 121/64   Wt Readings from Last 3 Encounters:  02/17/24 221 lb (100.2 kg)  11/26/23 221 lb (100.2 kg)  11/05/23 221 lb (100.2 kg)      Physical Exam Vitals and nursing note reviewed.  Constitutional:      Appearance: Normal appearance. She is obese.     Comments: Ambulating with scooter and left foot boot  HENT:     Head: Normocephalic and atraumatic.     Right Ear: Tympanic membrane, ear canal and  external ear normal.     Left Ear: Tympanic membrane, ear canal and external ear normal.     Nose: Nose normal.     Mouth/Throat:     Mouth: Mucous membranes are moist.     Pharynx: Oropharynx is clear.  Eyes:     Extraocular Movements: Extraocular movements intact.     Conjunctiva/sclera: Conjunctivae normal.     Pupils: Pupils are equal, round, and reactive to light.  Cardiovascular:     Rate and Rhythm: Normal rate and regular rhythm.     Pulses: Normal pulses.     Heart sounds: Normal heart sounds.  Pulmonary:     Effort: Pulmonary effort is normal.     Breath sounds: Normal breath sounds.  Abdominal:     General: Bowel sounds are normal.     Palpations: Abdomen is soft.  Musculoskeletal:        General: Normal range of motion.     Cervical back: Normal range of motion and neck supple.  Feet:     Comments: Left foot boot in place s/p surgery Skin:    General: Skin is warm and dry.     Capillary Refill: Capillary refill takes less than 2 seconds.  Neurological:     General: No focal deficit present.     Mental Status: She is alert and oriented to person, place, and time. Mental status is at baseline.  Psychiatric:        Mood and Affect: Mood normal.        Behavior: Behavior normal.        Thought Content: Thought content normal.        Judgment: Judgment normal.      No results found for any visits on 03/05/24. Last CBC Lab Results  Component Value Date   WBC 6.2 02/27/2024   HGB 12.3 02/27/2024   HCT 37.7 02/27/2024   MCV 89.3 02/27/2024   MCH 29.1 02/27/2024   RDW 14.4 02/27/2024   PLT 177 02/27/2024   Last metabolic panel Lab Results  Component Value Date   GLUCOSE 82 02/27/2024   NA 140 02/27/2024   K 3.7 02/27/2024   CL 103 02/27/2024   CO2 27 02/27/2024   BUN 29 (H) 02/27/2024   CREATININE 1.06 (H) 02/27/2024   EGFR 64.0 02/25/2023   CALCIUM 9.9 02/27/2024   PROT 6.9 02/27/2024   ALBUMIN 4.2 12/06/2023   BILITOT 0.4 02/27/2024   AST 13  02/27/2024   ALT 11 02/27/2024   Last lipids Lab Results  Component Value Date   CHOL 179 02/27/2024   HDL 47 (L) 02/27/2024   LDLCALC 98 02/27/2024   TRIG 226 (H) 02/27/2024   CHOLHDL 3.8 02/27/2024   Last hemoglobin A1c Lab Results  Component Value Date   HGBA1C 5.6 12/06/2023   Last thyroid  functions Lab Results  Component Value Date   TSH 4.94 (H) 02/27/2024   Last vitamin D  Lab Results  Component Value Date   VD25OH 49.5 12/06/2023   Last vitamin B12 and Folate Lab Results  Component Value Date   VITAMINB12 805 12/06/2023        Assessment & Plan:    Routine Health Maintenance and Physical Exam  Immunization History  Administered Date(s) Administered   Fluad Trivalent(High Dose 65+) 11/05/2023   Moderna Sars-Covid-2 Vaccination 01/30/2020, 03/02/2020   Tdap 03/12/2018   Zoster, Live 08/01/2016    Health Maintenance  Topic Date Due   Hepatitis C Screening  Never done   Pneumonia Vaccine 35+ Years old (1 of 2 - PCV) Never done   Zoster Vaccines- Shingrix (1 of 2) 03/28/2005   DEXA SCAN  Never done   COVID-19 Vaccine (3 - 2024-25 season) 03/21/2024 (Originally 07/14/2023)   INFLUENZA VACCINE  06/12/2024   Medicare Annual Wellness (AWV)  03/05/2025   MAMMOGRAM  12/29/2025   DTaP/Tdap/Td (2 - Td or Tdap) 03/12/2028   Colonoscopy  11/23/2030   HPV VACCINES  Aged Out   Meningococcal B Vaccine  Aged Out    Discussed health benefits of physical activity, and encouraged her to engage in regular exercise appropriate for her age and condition.  Problem List Items Addressed This Visit     Hypothyroidism   TSH 4.94. Is taking Biotin. Increase Synthroid  to 112mcg daily and recheck in 6-8 weeks.   The correct intake of thyroid  hormone (Levothyroxine , Synthroid ), is on empty stomach first thing in the morning, with water, separated by at least 30 minutes from breakfast and other medications,  and separated  by more than 4 hours from calcium, iron, multivitamins,  acid reflux medications (PPIs).   - This medication is a life-long medication and will be needed to correct thyroid  hormone imbalances for the rest of your life.  The dose may change from time to time, based on thyroid  blood work.   - It is extremely important to be consistent taking this medication, near the same time each morning.   -AVOID TAKING PRODUCTS CONTAINING BIOTIN (commonly found in Hair, Skin, Nails vitamins) AS IT INTERFERES WITH THE VALIDITY OF THYROID  FUNCTION BLOOD TESTS.       Relevant Medications   levothyroxine  (SYNTHROID ) 112 MCG tablet   Other Relevant Orders   TSH   Morbid obesity (HCC)   Counseled on importance of weight management for overall health. Encouraged low calorie, heart healthy diet. Exercise limited by recent orthopedic foot surgery.       Primary hypertension   Chronic, well controlled. Continue Lisinopril  and hydrochlorothiazide. Recommend heart healthy diet such as Mediterranean diet with whole grains, fruits, vegetable, fish, lean meats, nuts, and olive oil. Limit salt. Encouraged moderate walking, 3-5 times/week for 30-50 minutes each session. Aim for at least 150 minutes.week. Goal should be pace of 3 miles/hours, or walking 1.5 miles in 30 minutes. Avoid tobacco products. Avoid excess alcohol. Take medications as prescribed and bring medications and blood pressure log with cuff to each office visit. Seek medical care for chest pain, palpitations, shortness of breath with exertion, dizziness/lightheadedness, vision changes, recurrent headaches, or swelling of extremities. Follow up in 6 months      Relevant Medications   lisinopril  (ZESTRIL ) 20 MG tablet   Other Relevant Orders   Comprehensive metabolic panel with GFR   Mixed hyperlipidemia   Intolerant of statins, continue Zetia 10mg  daily. Your labs showed elevated cholesterol. I recommend consuming a heart healthy diet such as Mediterranean diet or DASH diet with whole grains, fruits, vegetable,  fish, lean meats, nuts, and olive oil. Limit sweets and processed foods. I also encourage moderate intensity exercise 150 minutes weekly. This is 3-5 times weekly for 30-50 minutes each session. Goal should be pace of 3 miles/hours, or walking 1.5 miles in 30 minutes. The 10-year ASCVD risk score (Arnett DK, et al., 2019) is: 8.8%       Relevant Medications   lisinopril  (ZESTRIL ) 20 MG tablet   Other Relevant Orders   Comprehensive metabolic panel with GFR   Physical exam, annual - Primary   Today your medical history was reviewed and routine physical exam with labs was performed. Recommend 150 minutes of moderate intensity exercise weekly and consuming a well-balanced diet. Advised to stop smoking if a smoker, avoid smoking if a non-smoker, limit alcohol consumption to 1 drink per day for women and 2 drinks per day for men, and avoid illicit drug use. Counseled in mental health awareness and when to seek medical care. Vaccine maintenance discussed. Appropriate health maintenance items reviewed. Return to office in 1 year for annual physical exam.       Gastroesophageal reflux disease without esophagitis   Chronic well controlled on Omeprazole 40mg  daily. Elevated HOB if needed and avoid lying down 2-3 hours after eating, avoid coffee, alcohol, chocolate, fatty foods, citrus, carbonated beverages, spicy foods, late meals, and smoking. Return to office if symptoms return or worsen and seek medical care for difficulty swallowing, bleeding, anemia, weight loss, recurrent vomiting        Other Visit Diagnoses       Encounter for Medicare  annual wellness exam          Return in 7 weeks (on 04/23/2024) for chronic follow-up with labs 1 week prior.     [name], FNP

## 2024-03-05 NOTE — Progress Notes (Signed)
 Subjective:   Sheila Kerr is a 68 y.o. female who presents for Medicare Annual (Subsequent) preventive examination.  Visit Complete: In person  Patient Medicare AWV questionnaire was completed by the patient on 03/05/2024; I have confirmed that all information answered by patient is correct and no changes since this date.        Objective:    Today's Vitals   03/05/24 0948 03/05/24 1042  BP: 118/78   Pulse: 84   Resp: 16   SpO2: 99%   Height: 5\' 5"  (1.651 m)   PainSc:  0-No pain   Body mass index is 36.78 kg/m.      No data to display          Current Medications (verified) Outpatient Encounter Medications as of 03/05/2024  Medication Sig   albuterol (VENTOLIN HFA) 108 (90 Base) MCG/ACT inhaler    ascorbic acid  (VITAMIN C) 500 MG tablet Take 500 mg by mouth daily.   Cholecalciferol (VITAMIN D3) 50 MCG (2000 UT) CHEW Chew by mouth.   Collagen-Vitamin C-Biotin (COLLAGEN PO) Take 600 mg by mouth.   diclofenac (VOLTAREN) 50 MG EC tablet Take 50 mg by mouth 2 (two) times daily.   doxycycline  (VIBRA -TABS) 100 MG tablet Take 1 tablet (100 mg total) by mouth 2 (two) times daily. (Patient not taking: Reported on 03/05/2024)   ezetimibe (ZETIA) 10 MG tablet    FLUTICASONE PROPIONATE HFA IN Inhale into the lungs.   gentamicin  ointment (GARAMYCIN ) 0.1 % Apply 1 Application topically daily. Apply to wound daily   hydrochlorothiazide (HYDRODIURIL) 12.5 MG tablet    Lactobacillus (PROBIOTIC ACIDOPHILUS PO) Take by mouth.   levofloxacin  (LEVAQUIN ) 750 MG tablet Take 1 tablet (750 mg total) by mouth daily.   levothyroxine  (SYNTHROID ) 112 MCG tablet Take 1 tablet (112 mcg total) by mouth daily before breakfast.   lisinopril  (ZESTRIL ) 20 MG tablet Take 1 tablet (20 mg total) by mouth daily.   Multiple Vitamins-Minerals (ALIVE DIABETIC MULTIVITAMIN PO) Take by mouth.   omeprazole (PRILOSEC) 40 MG capsule SMARTSIG:1 Capsule(s) By Mouth Every Evening   OVER THE COUNTER MEDICATION  Pt takes ALLERTEC   [DISCONTINUED] levothyroxine  (SYNTHROID ) 100 MCG tablet    [DISCONTINUED] lisinopril  (ZESTRIL ) 20 MG tablet Take 1 tablet (20 mg total) by mouth daily.   No facility-administered encounter medications on file as of 03/05/2024.    Allergies (verified) Amoxicillin -pot clavulanate, Adhesive [tape], and Latex   History: Past Medical History:  Diagnosis Date   Allergy    Asthma    GERD (gastroesophageal reflux disease)    Hyperlipidemia    Hypertension    Thyroid  disease    Past Surgical History:  Procedure Laterality Date   ABDOMINAL HYSTERECTOMY     FOOT SURGERY  08/26/2023   both foot one in 2023   History reviewed. No pertinent family history. Social History   Socioeconomic History   Marital status: Married    Spouse name: Not on file   Number of children: Not on file   Years of education: Not on file   Highest education level: 12th grade  Occupational History   Not on file  Tobacco Use   Smoking status: Never   Smokeless tobacco: Never  Substance and Sexual Activity   Alcohol use: Not Currently   Drug use: Never   Sexual activity: Not on file  Other Topics Concern   Not on file  Social History Narrative   Not on file   Social Drivers of Health   Financial  Resource Strain: Low Risk  (03/05/2024)   Overall Financial Resource Strain (CARDIA)    Difficulty of Paying Living Expenses: Not very hard  Food Insecurity: No Food Insecurity (03/05/2024)   Hunger Vital Sign    Worried About Running Out of Food in the Last Year: Never true    Ran Out of Food in the Last Year: Never true  Transportation Needs: No Transportation Needs (03/05/2024)   PRAPARE - Administrator, Civil Service (Medical): No    Lack of Transportation (Non-Medical): No  Physical Activity: Inactive (03/05/2024)   Exercise Vital Sign    Days of Exercise per Week: 0 days    Minutes of Exercise per Session: 0 min  Stress: No Stress Concern Present (03/05/2024)    [employer] of Occupational Health - Occupational Stress Questionnaire    Feeling of Stress : Only a little  Social Connections: Moderately Integrated (03/05/2024)   Social Connection and Isolation Panel [NHANES]    Frequency of Communication with Friends and Family: More than three times a week    Frequency of Social Gatherings with Friends and Family: Twice a week    Attends Religious Services: More than 4 times per year    Active Member of [employer] or Organizations: No    Attends Engineer, structural: Not on file    Marital Status: Married    Tobacco Counseling Counseling given: Not Answered   Clinical Intake:     Pain : No/denies pain Pain Score: 0-No pain     Nutritional Risks: Other (Comment) (patient refused due to boot on foot) Diabetes: No  How often do you need to have someone help you when you read instructions, pamphlets, or other written materials from your doctor or pharmacy?: 1 - Never What is the last grade level you completed in school?: 12th  Interpreter Needed?: No      Activities of Daily Living    03/05/2024   10:43 AM  In your present state of health, do you have any difficulty performing the following activities:  Hearing? 0  Vision? 0  Difficulty concentrating or making decisions? 1  Walking or climbing stairs? 0  Dressing or bathing? 0  Doing errands, shopping? 0  Preparing Food and eating ? N  Using the Toilet? N  In the past six months, have you accidently leaked urine? N  Do you have problems with loss of bowel control? N  Managing your Medications? N  Managing your Finances? N  Housekeeping or managing your Housekeeping? N    Patient Care Team: [name], FNP as PCP - General (Family Medicine)  Indicate any recent Medical Services you may have received from other than [facility] providers in the past year (date may be approximate).     Assessment:   This is a routine wellness examination for  [place].  Hearing/Vision screen No results found.   Goals Addressed             This Visit's Progress    Patient Stated       To get her foot healed and get back to doing the things she enjoys        Depression Screen    03/05/2024    9:47 AM 02/17/2024   12:58 PM  PHQ 2/9 Scores  PHQ - 2 Score 0 0  PHQ- 9 Score 0     Fall Risk    03/05/2024    9:47 AM 02/17/2024   12:58 PM  Fall Risk  Falls in the past year? 0 0  Number falls in past yr: 0 0  Injury with Fall? 0 0  Risk for fall due to : No Fall Risks   Follow up Falls evaluation completed;Falls prevention discussed     MEDICARE RISK AT HOME: Medicare Risk at Home Any stairs in or around the home?: Yes If so, are there any without handrails?: No Home free of loose throw rugs in walkways, pet beds, electrical cords, etc?: Yes Adequate lighting in your home to reduce risk of falls?: Yes Life alert?: No Use of a cane, walker or w/c?: Yes Grab bars in the bathroom?: Yes Shower chair or bench in shower?: No Elevated toilet seat or a handicapped toilet?: No  TIMED UP AND GO:  Was the test performed?  No    Cognitive Function:        03/05/2024   10:46 AM  6CIT Screen  What Year? 0 points  What month? 0 points  What time? 0 points  Count back from 20 0 points  Months in reverse 0 points  Repeat phrase 0 points  Total Score 0 points    Immunizations Immunization History  Administered Date(s) Administered   Fluad Trivalent(High Dose 65+) 11/05/2023   Moderna Sars-Covid-2 Vaccination 01/30/2020, 03/02/2020   Tdap 03/12/2018   Zoster, Live 08/01/2016    TDAP status: Up to date  Flu Vaccine status: Up to date  Pneumococcal vaccine status: Up to date  Covid-19 vaccine status: Completed vaccines  Qualifies for Shingles Vaccine?  Previously completed   Zostavax completed Yes   Shingrix Completed?: Yes  Screening Tests Health Maintenance  Topic Date Due   Hepatitis C Screening  Never done    Pneumonia Vaccine 33+ Years old (1 of 2 - PCV) Never done   Zoster Vaccines- Shingrix (1 of 2) 03/28/2005   DEXA SCAN  Never done   COVID-19 Vaccine (3 - 2024-25 season) 03/21/2024 (Originally 07/14/2023)   INFLUENZA VACCINE  06/12/2024   Medicare Annual Wellness (AWV)  03/05/2025   MAMMOGRAM  12/29/2025   DTaP/Tdap/Td (2 - Td or Tdap) 03/12/2028   Colonoscopy  11/23/2030   HPV VACCINES  Aged Out   Meningococcal B Vaccine  Aged Out    Health Maintenance  Health Maintenance Due  Topic Date Due   Hepatitis C Screening  Never done   Pneumonia Vaccine 26+ Years old (1 of 2 - PCV) Never done   Zoster Vaccines- Shingrix (1 of 2) 03/28/2005   DEXA SCAN  Never done    Colorectal cancer screening: Type of screening: Colonoscopy. Completed 2022. Repeat every 10 years  Mammogram status: Completed 2024. Repeat every year  Bone Density status: Completed 2024. Results reflect: Bone density results: NORMAL. Repeat every 3 years.  Lung Cancer Screening: (Low Dose CT Chest recommended if Age 57-80 years, 20 pack-year currently smoking OR have quit w/in 15years.) does not qualify.   Lung Cancer Screening Referral:   Additional Screening:  Hepatitis C Screening: does not qualify; Completed   Vision Screening: Recommended annual ophthalmology exams for early detection of glaucoma and other disorders of the eye. Is the patient up to date with their annual eye exam?  Yes  Who is the provider or what is the name of the office in which the patient attends annual eye exams?  If pt is not established with a provider, would they like to be referred to a provider to establish care? No .   Dental Screening: Recommended annual dental exams for  proper oral hygiene  Diabetic Foot Exam: n/a  Community Resource Referral / Chronic Care Management: CRR required this visit?  No   CCM required this visit?  No     Plan:     I have personally reviewed and noted the following in the patient's chart:    Medical and social history Use of alcohol, tobacco or illicit drugs  Current medications and supplements including opioid prescriptions. Patient is not currently taking opioid prescriptions. Functional ability and status Nutritional status Physical activity Advanced directives List of other physicians Hospitalizations, surgeries, and ER visits in previous 12 months Vitals Screenings to include cognitive, depression, and falls Referrals and appointments  In addition, I have reviewed and discussed with patient certain preventive protocols, quality metrics, and best practice recommendations. A written personalized care plan for preventive services as well as general preventive health recommendations were provided to patient.     [name], FNP   03/05/2024   After Visit Summary: (In Person-Printed) AVS printed and given to the patient  Nurse Notes: n/a

## 2024-03-05 NOTE — Assessment & Plan Note (Signed)
 Chronic well controlled on Omeprazole 40mg  daily. Elevated HOB if needed and avoid lying down 2-3 hours after eating, avoid coffee, alcohol, chocolate, fatty foods, citrus, carbonated beverages, spicy foods, late meals, and smoking. Return to office if symptoms return or worsen and seek medical care for difficulty swallowing, bleeding, anemia, weight loss, recurrent vomiting

## 2024-03-05 NOTE — Assessment & Plan Note (Signed)
 Counseled on importance of weight management for overall health. Encouraged low calorie, heart healthy diet. Exercise limited by recent orthopedic foot surgery.

## 2024-03-05 NOTE — Assessment & Plan Note (Signed)
 TSH 4.94. Is taking Biotin. Increase Synthroid  to 112mcg daily and recheck in 6-8 weeks.   The correct intake of thyroid  hormone (Levothyroxine , Synthroid ), is on empty stomach first thing in the morning, with water, separated by at least 30 minutes from breakfast and other medications,  and separated by more than 4 hours from calcium, iron, multivitamins, acid reflux medications (PPIs).   - This medication is a life-long medication and will be needed to correct thyroid  hormone imbalances for the rest of your life.  The dose may change from time to time, based on thyroid  blood work.   - It is extremely important to be consistent taking this medication, near the same time each morning.   -AVOID TAKING PRODUCTS CONTAINING BIOTIN (commonly found in Hair, Skin, Nails vitamins) AS IT INTERFERES WITH THE VALIDITY OF THYROID  FUNCTION BLOOD TESTS.

## 2024-03-05 NOTE — Patient Instructions (Addendum)
  Sheila Kerr , Thank you for taking time to come for your Medicare Wellness Visit. I appreciate your ongoing commitment to your health goals. Please review the following plan we discussed and let me know if I can assist you in the future.   These are the goals we discussed:  Goals      Patient Stated     To get her foot healed and get back to doing the things she enjoys         This is a list of the screening recommended for you and due dates:  Health Maintenance  Topic Date Due   Hepatitis C Screening  Never done   Pneumonia Vaccine (1 of 2 - PCV) Never done   Zoster (Shingles) Vaccine (1 of 2) 03/28/2005   DEXA scan (bone density measurement)  Never done   COVID-19 Vaccine (3 - 2024-25 season) 03/21/2024*   Flu Shot  06/12/2024   Medicare Annual Wellness Visit  03/05/2025   Mammogram  12/29/2025   DTaP/Tdap/Td vaccine (2 - Td or Tdap) 03/12/2028   Colon Cancer Screening  11/23/2030   HPV Vaccine  Aged Out   Meningitis B Vaccine  Aged Out  *Topic was postponed. The date shown is not the original due date.

## 2024-03-05 NOTE — Assessment & Plan Note (Signed)

## 2024-03-06 DIAGNOSIS — L858 Other specified epidermal thickening: Secondary | ICD-10-CM | POA: Diagnosis not present

## 2024-03-06 DIAGNOSIS — T8484XA Pain due to internal orthopedic prosthetic devices, implants and grafts, initial encounter: Secondary | ICD-10-CM | POA: Diagnosis not present

## 2024-03-06 DIAGNOSIS — M21962 Unspecified acquired deformity of left lower leg: Secondary | ICD-10-CM | POA: Diagnosis not present

## 2024-03-06 DIAGNOSIS — M898X7 Other specified disorders of bone, ankle and foot: Secondary | ICD-10-CM | POA: Diagnosis not present

## 2024-03-06 DIAGNOSIS — L97422 Non-pressure chronic ulcer of left heel and midfoot with fat layer exposed: Secondary | ICD-10-CM | POA: Diagnosis not present

## 2024-03-06 DIAGNOSIS — M86672 Other chronic osteomyelitis, left ankle and foot: Secondary | ICD-10-CM | POA: Diagnosis not present

## 2024-03-12 ENCOUNTER — Ambulatory Visit (INDEPENDENT_AMBULATORY_CARE_PROVIDER_SITE_OTHER): Admitting: Podiatry

## 2024-03-12 ENCOUNTER — Encounter: Payer: Self-pay | Admitting: Podiatry

## 2024-03-12 VITALS — Ht 65.0 in | Wt 221.0 lb

## 2024-03-12 DIAGNOSIS — L97522 Non-pressure chronic ulcer of other part of left foot with fat layer exposed: Secondary | ICD-10-CM

## 2024-03-12 NOTE — Progress Notes (Signed)
  Subjective:  Patient ID: Sheila Kerr, female    DOB: Mar 30, 1956,  MRN: [id number]  Chief Complaint  Patient presents with   Routine Post Op    Rm2: POV # 1 DOS 03/06/24 LEFT PIN REMOVAL AND BONE BIOPSY Patient is doing well incision look good     68 y.o. female returns for post-op check.   Review of Systems: Negative except as noted in the HPI. Denies N/V/F/Ch.   Objective:  There were no vitals filed for this visit. Body mass index is 36.78 kg/m. Constitutional Well developed. Well nourished.  Vascular Foot warm and well perfused. Capillary refill normal to all digits.  Calf is soft and supple, no posterior calf or knee pain, negative Homans' sign  Neurologic Normal speech. Oriented to person, place, and time. Epicritic sensation to light touch grossly present bilaterally.  Dermatologic Biopsy incisions well coapted.  There is minimal edema no erythema no drainage from the wound it is quite small and healing very well with a granular wound bed  Orthopedic: Tenderness to palpation noted about the surgical site.   Assessment:   1. 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  S/p foot surgery left -Progressing as expected post-operatively.  Doing well and healing appropriately.  Gently debrided the wound today and applied collagen matrix powder and dispensed this today to use at home.  Return in 2 weeks for suture removal and reevaluation.  Path and culture results are pending should have final results tomorrow.  Expect we will able to proceed with MTP arthrodesis shortly after likely late May   No follow-ups on file.

## 2024-03-24 ENCOUNTER — Ambulatory Visit (INDEPENDENT_AMBULATORY_CARE_PROVIDER_SITE_OTHER)

## 2024-03-24 ENCOUNTER — Other Ambulatory Visit: Payer: Self-pay

## 2024-03-24 ENCOUNTER — Ambulatory Visit: Payer: Self-pay | Admitting: Internal Medicine

## 2024-03-24 ENCOUNTER — Ambulatory Visit (INDEPENDENT_AMBULATORY_CARE_PROVIDER_SITE_OTHER): Admitting: Podiatry

## 2024-03-24 ENCOUNTER — Encounter: Payer: Self-pay | Admitting: Podiatry

## 2024-03-24 VITALS — BP 104/67 | HR 97 | Temp 98.2°F

## 2024-03-24 VITALS — Ht 66.0 in | Wt 221.0 lb

## 2024-03-24 DIAGNOSIS — M869 Osteomyelitis, unspecified: Secondary | ICD-10-CM | POA: Diagnosis not present

## 2024-03-24 DIAGNOSIS — M86472 Chronic osteomyelitis with draining sinus, left ankle and foot: Secondary | ICD-10-CM

## 2024-03-24 MED ORDER — LEVOFLOXACIN 500 MG PO TABS: 500.0000 mg | ORAL_TABLET | Freq: Every day | ORAL | 2 refills | Status: DC

## 2024-03-24 NOTE — Patient Instructions (Addendum)
 Last dose of levaquin  750 mg per day on 5/14 Then on 5/15 start 500mg  of  levaquin  a day Stop antibiotics one week prior to biopsy Start levaquin  500 again following biopsy F/u in one month

## 2024-03-24 NOTE — Progress Notes (Signed)
 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   CHOLECALCIFEROL (VITAMIN D3) 50 MCG (2000 UT) CHEW    Chew by mouth.   COLLAGEN-VITAMIN C-BIOTIN (COLLAGEN PO)    Take 600 mg by mouth.   DICLOFENAC (VOLTAREN) 50 MG EC TABLET    Take 50 mg by mouth 2 (two) times daily.   DOXYCYCLINE  (VIBRA -TABS) 100 MG TABLET    Take 1 tablet (100 mg total) by mouth 2 (two) times daily.   EZETIMIBE (ZETIA) 10 MG TABLET       FLUTICASONE PROPIONATE HFA IN    Inhale into the lungs.   GENTAMICIN  OINTMENT (GARAMYCIN ) 0.1 %    Apply 1 Application topically daily. Apply to wound daily   HYDROCHLOROTHIAZIDE (HYDRODIURIL) 12.5 MG TABLET       LACTOBACILLUS (PROBIOTIC ACIDOPHILUS PO)    Take by mouth.   LEVOFLOXACIN  (LEVAQUIN ) 750 MG TABLET    Take 1 tablet (750 mg total) by mouth daily.   LEVOTHYROXINE  (SYNTHROID ) 112 MCG TABLET    Take 1 tablet (112 mcg total) by mouth daily before breakfast.   LISINOPRIL  (ZESTRIL ) 20 MG TABLET    Take 1 tablet (20 mg total) by mouth daily.   MULTIPLE VITAMINS-MINERALS (ALIVE DIABETIC MULTIVITAMIN PO)    Take by mouth.   OMEPRAZOLE (PRILOSEC) 40 MG CAPSULE    SMARTSIG:1 Capsule(s) By Mouth Every Evening   OVER THE COUNTER MEDICATION    Pt takes ALLERTEC  Modified Medications   No medications on file  Discontinued Medications   No medications on file     Subjective: Sheila Kerr is a 68 y.o. F with PMHx as below presents for referral for left foot wound.  Patient had gone to the OR on 3/14 with podiatry for sesamoid excision and peeling of the left foot toe.  It was noted that cultures grew staph lugdunensis and deep culture swab, Klebsiella and sesamoid, Enterobacter proximal phalanx with no growth in metatarsal.  Patient was started on Levaquin  on/3 and given infectious disease follow-up for further management. Today 03/24/24: Patient states she is tolerating antibiotics overall.  She has had only 2 bouts of diarrhea in the past week or so.  No diarrhea today.  She is not taking probiotics.   Review of Systems: Review of Systems  All other systems reviewed and are negative.   Past Medical History:  Diagnosis Date   Allergy    Asthma    GERD (gastroesophageal reflux disease)    Hyperlipidemia    Hypertension    Thyroid  disease     Social History   Tobacco Use   Smoking status: Never   Smokeless tobacco: Never  Substance Use Topics   Alcohol use: Not Currently   Drug use: Never    No family history on file.  Allergies  Allergen Reactions   Amoxicillin -Pot Clavulanate Nausea And Vomiting   Adhesive [Tape]    Latex Rash    Health Maintenance  Topic Date Due   Hepatitis C Screening  Never done   Pneumonia Vaccine 71+ Years old (1 of 2 - PCV) Never done   Zoster Vaccines- Shingrix (1 of 2) 03/28/2005   DEXA SCAN  Never done   COVID-19 Vaccine (3 - 2024-25 season) 07/14/2023   INFLUENZA VACCINE  06/12/2024   Medicare Annual Wellness (AWV)  03/05/2025   MAMMOGRAM  12/29/2025   DTaP/Tdap/Td (2 - Td or Tdap) 03/12/2028   Colonoscopy  11/23/2030   HPV VACCINES  Aged Out   Meningococcal B Vaccine  Aged Out    Objective:  Vitals:   03/24/24 1037  BP: 104/67  Pulse: 97  Temp: 98.2 F (36.8 C)  TempSrc: Oral  SpO2: 100%   There is no height or weight on file to calculate BMI.  Physical  Exam Constitutional:      Appearance: Normal appearance.  HENT:     Head: Normocephalic and atraumatic.     Right Ear: Tympanic membrane normal.     Left Ear: Tympanic membrane normal.     Nose: Nose normal.     Mouth/Throat:     Mouth: Mucous membranes are moist.  Eyes:     Extraocular Movements: Extraocular movements intact.     Conjunctiva/sclera: Conjunctivae normal.     Pupils: Pupils are equal, round, and reactive to light.  Cardiovascular:     Rate and Rhythm: Normal rate and regular rhythm.     Heart sounds: No murmur heard.    No friction rub. No gallop.  Pulmonary:     Effort: Pulmonary effort is normal.     Breath sounds: Normal breath sounds.  Abdominal:     General: Abdomen is flat.     Palpations: Abdomen is soft.  Skin:    General: Skin is warm and dry.  Neurological:     General: No focal deficit present.     Mental Status: She is alert and oriented to person, place, and time.  Psychiatric:        Mood and Affect: Mood normal.    Lab Results Lab Results  Component Value Date   WBC 6.2 02/27/2024   HGB 12.3 02/27/2024   HCT 37.7 02/27/2024   MCV 89.3 02/27/2024   PLT 177 02/27/2024    Lab Results  Component Value Date   CREATININE 1.06 (H) 02/27/2024   BUN 29 (H) 02/27/2024   NA 140 02/27/2024   K 3.7 02/27/2024   CL 103 02/27/2024   CO2 27 02/27/2024    Lab Results  Component Value Date   ALT 11 02/27/2024   AST 13 02/27/2024   BILITOT 0.4 02/27/2024    Lab Results  Component Value Date   CHOL 179 02/27/2024   HDL 47 (L) 02/27/2024   LDLCALC 98 02/27/2024   TRIG 226 (H) 02/27/2024   CHOLHDL 3.8 02/27/2024   No results found for: "LABRPR", "RPRTITER" No results found for: "HIV1RNAQUANT", "HIV1RNAVL", "CD4TABS"   Problem List Items Addressed This Visit   None  Assessment/Plan # 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 - Taken to the OR 3/14 for excision sesamoid, rotation flap and pinning of toe.  Noted  that deep culture swab grew staph lugdunensis.  Klebsiella and sesamoid.  Enterobacter cloacae in proximal phalanx, metatarsal with no growth. - Patient has been on Levaquin  since 4/3.  H she is overall tolerating it.  On probiotics now. - I spoke to Dr. Londa Rival today.  Will plan on 6 weeks antibiotics with Levaquin .  EOT 5/14.  Plan for Possible fusion, I would recommend that at the end of treatment.  Would recommend biopsy prior to having fusion to make sure cultures are clean.  I think ideally this would have to be done off antibiotics. Although pt will need suppresion, plan on biopsy as possible fusion planned to see if infection persists.  Plan -last esr and crp stable on 4/7 -labs today -Last dose of levaquin  750 mg per day on 5/14 T-hen on 5/15 start 500mg  of  levaquin  a day -Stop antibiotics one week prior to biopsy -Start levaquin  500 again following biopsy -F/u in one month [name], MD [facility] 03/24/2024, 10:42 AM   I have personally spent 45 minutes involved in face-to-face and non-face-to-face activities for this patient on the day of the visit. Professional time spent includes the following activities: Preparing to see the patient (review of tests), Obtaining and/or reviewing separately obtained history (admission/discharge record), Performing a medically appropriate examination and/or evaluation , Ordering medications/tests/procedures, referring and communicating with other health care professionals, Documenting clinical information in the EMR, Independently interpreting results (not separately reported), Communicating results to the patient/family/caregiver, Counseling and educating the patient/family/caregiver and Care coordination (not separately reported).

## 2024-03-25 LAB — SEDIMENTATION RATE: Sed Rate: 25 mm/h (ref 0–30)

## 2024-03-25 LAB — C-REACTIVE PROTEIN: CRP: <3 mg/L (ref <8.0)

## 2024-03-26 ENCOUNTER — Encounter: Admitting: Podiatry

## 2024-03-26 ENCOUNTER — Telehealth: Payer: Self-pay | Admitting: Podiatry

## 2024-03-26 NOTE — Telephone Encounter (Signed)
 DOS: 04/10/24  (LT) HALLUX MPJ FUSION -[id number] (LT) BONE GRAFT -[id number]     EFFECTIVE DATE :  11/12/2021   OOP:   $6,750.00  REMAINING:   $5,640.00  COINSURANCE:  0%    CPT CODE 04540 APPROVED  Authorization #[id number]  Tracking #[id number]  NO PRIOR AUTH REQ FOR CPT CODE 29562

## 2024-03-26 NOTE — Progress Notes (Signed)
  Subjective:  Patient ID: Sheila Kerr, female    DOB: Dec 03, 1955,  MRN: [id number]  Chief Complaint  Patient presents with   Routine Post Op    POV # 2 DOS 03/06/24 LEFT PIN REMOVAL AND BONE BIOPSY     68 y.o. female returns for post-op check.   Review of Systems: Negative except as noted in the HPI. Denies N/V/F/Ch.   Objective:  There were no vitals filed for this visit. Body mass index is 35.67 kg/m. Constitutional Well developed. Well nourished.  Vascular Foot warm and well perfused. Capillary refill normal to all digits.  Calf is soft and supple, no posterior calf or knee pain, negative Homans' sign  Neurologic Normal speech. Oriented to person, place, and time. Epicritic sensation to light touch grossly present bilaterally.  Dermatologic Biopsy incisions well healed.  There is a small scab present at the previous ulcer site.  No edema swelling drainage or sign of infection  Orthopedic: Tenderness to palpation noted about the surgical site.    Radiographs taken today showed no bony changes except for biopsy sites  Pathology results and culture results showed no growth on any sample and no osteomyelitis on any examined sections  Assessment:   1. 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  S/p foot surgery left - Doing very well wound is nearly fully healed.  Would like to see her back in 2 weeks to reevaluate but we should build to proceed later that week with reconstructive surgery with MTP arthrodesis with bone graft from a dorsal approach.  Discussed risk benefits and potential complications including but not limited to pain, swelling, infection, scar, numbness which may be temporary or permanent, chronic pain, stiffness, nerve pain or damage, wound healing problems, bone healing problems including delayed or non-union.  Also discussed the recovery process and need for  period of nonweightbearing while the graft integrates.  Informed consent signed and reviewed today.  All questions addressed.   Surgical plan:  Procedure: - Left first MTP arthrodesis with bone graft and B MAC  Location: - [facility]  Anesthesia plan: - Sedation with local  Postoperative pain plan: - Tylenol  1000 mg every 6 hours, ibuprofen  600 mg every 6 hours, gabapentin  300 mg every 8 hours x5 days, oxycodone  5 mg 1-2 tabs every 6 hours only as needed  DVT prophylaxis: -ASA 325 mg twice daily  WB Restrictions / DME needs: - Nonweight in CAM boot with knee scooter   No follow-ups on file.

## 2024-03-30 ENCOUNTER — Ambulatory Visit: Admitting: Internal Medicine

## 2024-04-07 ENCOUNTER — Encounter: Payer: Self-pay | Admitting: Podiatry

## 2024-04-07 ENCOUNTER — Ambulatory Visit (INDEPENDENT_AMBULATORY_CARE_PROVIDER_SITE_OTHER): Admitting: Podiatry

## 2024-04-07 VITALS — Ht 66.0 in | Wt 221.0 lb

## 2024-04-07 DIAGNOSIS — M205X2 Other deformities of toe(s) (acquired), left foot: Secondary | ICD-10-CM

## 2024-04-07 DIAGNOSIS — M21962 Unspecified acquired deformity of left lower leg: Secondary | ICD-10-CM

## 2024-04-07 DIAGNOSIS — M86472 Chronic osteomyelitis with draining sinus, left ankle and foot: Secondary | ICD-10-CM

## 2024-04-07 DIAGNOSIS — L97521 Non-pressure chronic ulcer of other part of left foot limited to breakdown of skin: Secondary | ICD-10-CM

## 2024-04-09 ENCOUNTER — Other Ambulatory Visit: Payer: Self-pay | Admitting: Podiatry

## 2024-04-09 MED ORDER — ACETAMINOPHEN 500 MG PO TABS: 1000.0000 mg | ORAL_TABLET | Freq: Four times a day (QID) | ORAL | 0 refills | Status: AC | PRN

## 2024-04-09 MED ORDER — OXYCODONE HCL 5 MG PO TABS: 5.0000 mg | ORAL_TABLET | ORAL | 0 refills | Status: AC | PRN

## 2024-04-09 MED ORDER — IBUPROFEN 600 MG PO TABS: 600.0000 mg | ORAL_TABLET | Freq: Four times a day (QID) | ORAL | 0 refills | Status: AC | PRN

## 2024-04-10 DIAGNOSIS — Z9889 Other specified postprocedural states: Secondary | ICD-10-CM | POA: Diagnosis not present

## 2024-04-10 DIAGNOSIS — M86472 Chronic osteomyelitis with draining sinus, left ankle and foot: Secondary | ICD-10-CM | POA: Diagnosis not present

## 2024-04-10 DIAGNOSIS — G8918 Other acute postprocedural pain: Secondary | ICD-10-CM | POA: Diagnosis not present

## 2024-04-10 DIAGNOSIS — M19072 Primary osteoarthritis, left ankle and foot: Secondary | ICD-10-CM | POA: Diagnosis not present

## 2024-04-10 DIAGNOSIS — M205X2 Other deformities of toe(s) (acquired), left foot: Secondary | ICD-10-CM | POA: Diagnosis not present

## 2024-04-10 DIAGNOSIS — M2022 Hallux rigidus, left foot: Secondary | ICD-10-CM | POA: Diagnosis not present

## 2024-04-10 DIAGNOSIS — M19272 Secondary osteoarthritis, left ankle and foot: Secondary | ICD-10-CM | POA: Diagnosis not present

## 2024-04-10 DIAGNOSIS — Z8739 Personal history of other diseases of the musculoskeletal system and connective tissue: Secondary | ICD-10-CM | POA: Diagnosis not present

## 2024-04-11 NOTE — Progress Notes (Signed)
  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 female returns for post-op check.   Review of Systems: Negative except as noted in the HPI. Denies N/V/F/Ch.   Objective:  There were no vitals filed for this visit. Body mass index is 35.67 kg/m. Constitutional Well developed. Well nourished.  Vascular Foot warm and well perfused. Capillary refill normal to all digits.  Calf is soft and supple, no posterior calf or knee pain, negative Homans' sign  Neurologic Normal speech. Oriented to person, place, and time. Epicritic sensation to light touch grossly present bilaterally.  Dermatologic Small fissure at previous ulcer site no active drainage, limited epithelial skin breakdown  Orthopedic: Tenderness to palpation noted about the surgical site.     Radiographs taken today showed no bony changes except for biopsy sites  Pathology results and culture results 03/06/24 showed no growth on any sample and no osteomyelitis on any examined sections  Assessment:   1. Ulcer of left foot, limited to breakdown of skin (HCC)   2. 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 Deformity of metatarsal bone of left foot   4. Hallux limitus, left     Plan:  Patient was evaluated and treated and all questions answered.  S/p foot surgery left - Remains well-healed.  Okay to proceed with surgery this week.  She will continue her antibiotics throughout the postoperative course.  All questions addressed.  Surgery planned for this Friday.   No follow-ups on file.

## 2024-04-15 ENCOUNTER — Other Ambulatory Visit: Payer: Self-pay | Admitting: Family Medicine

## 2024-04-15 NOTE — Telephone Encounter (Signed)
 Copied from CRM [id number]. Topic: Clinical - Medication Refill >> Apr 15, 2024  2:30 PM Thliyah D wrote: Medication: doxycycline  (VIBRA -TABS) 100 MG tablet, hydrochlorothiazide (HYDRODIURIL) 12.5 MG tablet  Has the patient contacted their pharmacy? No (Agent: If no, request that the patient contact the pharmacy for the refill. If patient does not wish to contact the pharmacy document the reason why and proceed with request.) (Agent: If yes, when and what did the pharmacy advise?)  This is the patient's preferred pharmacy:  [facility]  Is this the correct pharmacy for this prescription? Yes If no, delete pharmacy and type the correct one.   Has the prescription been filled recently? No  Is the patient out of the medication? No almost out  Has the patient been seen for an appointment in the last year OR does the patient have an upcoming appointment? Yes  Can we respond through MyChart? No  Agent: Please be advised that Rx refills may take up to 3 business days. We ask that you follow-up with your pharmacy.

## 2024-04-16 ENCOUNTER — Ambulatory Visit (INDEPENDENT_AMBULATORY_CARE_PROVIDER_SITE_OTHER)

## 2024-04-16 ENCOUNTER — Ambulatory Visit (INDEPENDENT_AMBULATORY_CARE_PROVIDER_SITE_OTHER): Admitting: Podiatry

## 2024-04-16 ENCOUNTER — Encounter: Payer: Self-pay | Admitting: Podiatry

## 2024-04-16 VITALS — Ht 66.0 in | Wt 221.0 lb

## 2024-04-16 DIAGNOSIS — L97521 Non-pressure chronic ulcer of other part of left foot limited to breakdown of skin: Secondary | ICD-10-CM | POA: Diagnosis not present

## 2024-04-16 DIAGNOSIS — M21962 Unspecified acquired deformity of left lower leg: Secondary | ICD-10-CM

## 2024-04-16 NOTE — Telephone Encounter (Signed)
 Requested medication (s) are due for refill today: yes  Requested medication (s) are on the active medication list: yes  Last refill:  01/24/24,07/26/20  Future visit scheduled: no  Notes to clinic:  Unable to refill per protocol, last refill by another provider.      Requested Prescriptions  Pending Prescriptions Disp Refills   doxycycline  (VIBRA -TABS) 100 MG tablet 28 tablet 0    Sig: Take 1 tablet (100 mg total) by mouth 2 (two) times daily.     Off-Protocol Failed - 04/16/2024  3:42 PM      Failed - Medication not assigned to a protocol, review manually.      Failed - Valid encounter within last 12 months    Recent Outpatient Visits           1 month ago Physical exam, annual   [facility] [name], FNP   5 months ago Primary hypertension   [facility] [name], FNP               hydrochlorothiazide (HYDRODIURIL) 12.5 MG tablet       Cardiovascular: Diuretics - Thiazide Failed - 04/16/2024  3:42 PM      Failed - Cr in normal range and within 180 days    Creat  Date Value Ref Range Status  02/27/2024 1.06 (H) 0.50 - 1.05 mg/dL Final   Creatinine, POC  Date Value Ref Range Status  02/25/2023 66.7 mg/dL Final    Comment:    Abstracted by HIM         Failed - Valid encounter within last 6 months    Recent Outpatient Visits           1 month ago Physical exam, annual   [facility] [name], FNP   5 months ago Primary hypertension   [place] [facility] [name], FNP              Passed - K in normal range and within 180 days    Potassium  Date Value Ref Range Status  02/27/2024 3.7 3.5 - 5.3 mmol/L Final         Passed - Na in normal range and within 180 days    Sodium  Date Value Ref Range Status  02/27/2024 140 135 - 146 mmol/L Final         Passed - Last BP in normal range    BP Readings from Last 1  Encounters:  03/24/24 104/67

## 2024-04-16 NOTE — Progress Notes (Signed)
  Subjective:  Patient ID: Sheila Kerr, female    DOB: 07-16-1956,  MRN: [id number]  Chief Complaint  Patient presents with   Routine Post Op    Pt is here for routine post op visit due to surgery on left foot, she states her foot is doing ok, has not had any major pain until last night states she took an extra tylenol  at bed time.    DOS: 04/10/2024 Procedure: Left first MTP fusion with autograft and allograft  68 y.o. female returns for post-op check.  She is doing well was not able to change the bandage  Review of Systems: Negative except as noted in the HPI. Denies N/V/F/Ch.   Objective:  There were no vitals filed for this visit. Body mass index is 35.67 kg/m. Constitutional Well developed. Well nourished.  Vascular Foot warm and well perfused. Capillary refill normal to all digits.  Calf is soft and supple, no posterior calf or knee pain, negative Homans' sign  Neurologic Normal speech. Oriented to person, place, and time. Epicritic sensation to light touch grossly present bilaterally.  Dermatologic Skin healing well without signs of infection. Skin edges well coapted without signs of infection.  Small area of bleeding dorsally reinforced with Steri-Strip without active further bleeding.  Previous plantar ulcer is now a small fissure  Orthopedic: Mild pain and edema to palpation noted about the surgical site.   Multiple view plain film radiographs: Good correction noted hardware intact and in equivalent postoperative position Assessment:   1. Ulcer of left foot, limited to breakdown of skin (HCC)   2. Deformity of metatarsal bone of left foot    Plan:  Patient was evaluated and treated and all questions answered.  S/p foot surgery left -Progressing as expected post-operatively.  X-rays reviewed continue nonweightbearing in cam boot continue antibiotics she has refills on these, she will dress every other day for the plantar ulcer which is nearly fully healed and  has good epithelialization, pain is controlled only with Tylenol  now.  Return in 2 weeks for suture removal expected following week after that at the beginning of week 5 will begin weightbearing in boot and then after third visit transition hopefully to surgical shoe around 8 weeks  No follow-ups on file.

## 2024-04-20 ENCOUNTER — Encounter: Payer: Self-pay | Admitting: Family Medicine

## 2024-04-21 ENCOUNTER — Other Ambulatory Visit: Payer: Self-pay | Admitting: Family Medicine

## 2024-04-22 ENCOUNTER — Other Ambulatory Visit

## 2024-04-22 DIAGNOSIS — I10 Essential (primary) hypertension: Secondary | ICD-10-CM | POA: Diagnosis not present

## 2024-04-22 DIAGNOSIS — E039 Hypothyroidism, unspecified: Secondary | ICD-10-CM | POA: Diagnosis not present

## 2024-04-22 DIAGNOSIS — E782 Mixed hyperlipidemia: Secondary | ICD-10-CM | POA: Diagnosis not present

## 2024-04-23 LAB — COMPREHENSIVE METABOLIC PANEL WITH GFR
AG Ratio: 1.8 (calc) (ref 1.0–2.5)
ALT: 9 U/L (ref 6–29)
AST: 10 U/L (ref 10–35)
Albumin: 4.4 g/dL (ref 3.6–5.1)
Alkaline phosphatase (APISO): 74 U/L (ref 37–153)
BUN/Creatinine Ratio: 45 (calc) — ABNORMAL HIGH (ref 6–22)
BUN: 49 mg/dL — ABNORMAL HIGH (ref 7–25)
CO2: 26 mmol/L (ref 20–32)
Calcium: 10.1 mg/dL (ref 8.6–10.4)
Chloride: 102 mmol/L (ref 98–110)
Creat: 1.09 mg/dL — ABNORMAL HIGH (ref 0.50–1.05)
Globulin: 2.5 g/dL (ref 1.9–3.7)
Glucose, Bld: 86 mg/dL (ref 65–99)
Potassium: 3.6 mmol/L (ref 3.5–5.3)
Sodium: 141 mmol/L (ref 135–146)
Total Bilirubin: 0.5 mg/dL (ref 0.2–1.2)
Total Protein: 6.9 g/dL (ref 6.1–8.1)
eGFR: 55 mL/min/{1.73_m2} — ABNORMAL LOW (ref >=60)

## 2024-04-23 LAB — TSH: TSH: 0.7 m[IU]/L (ref 0.40–4.50)

## 2024-04-27 ENCOUNTER — Ambulatory Visit (INDEPENDENT_AMBULATORY_CARE_PROVIDER_SITE_OTHER): Admitting: Family Medicine

## 2024-04-27 ENCOUNTER — Encounter: Payer: Self-pay | Admitting: Family Medicine

## 2024-04-27 VITALS — BP 121/70 | HR 106 | Temp 98.2°F | Ht 66.0 in | Wt 211.0 lb

## 2024-04-27 DIAGNOSIS — E782 Mixed hyperlipidemia: Secondary | ICD-10-CM

## 2024-04-27 DIAGNOSIS — E039 Hypothyroidism, unspecified: Secondary | ICD-10-CM

## 2024-04-27 DIAGNOSIS — N1831 Chronic kidney disease, stage 3a: Secondary | ICD-10-CM | POA: Diagnosis not present

## 2024-04-27 DIAGNOSIS — I10 Essential (primary) hypertension: Secondary | ICD-10-CM

## 2024-04-27 MED ORDER — LISINOPRIL 20 MG PO TABS: 20.0000 mg | ORAL_TABLET | Freq: Every day | ORAL | 1 refills | Status: DC

## 2024-04-27 MED ORDER — DICLOFENAC SODIUM 50 MG PO TBEC: 50.0000 mg | DELAYED_RELEASE_TABLET | Freq: Two times a day (BID) | ORAL | 1 refills | Status: DC

## 2024-04-27 MED ORDER — HYDROCHLOROTHIAZIDE 12.5 MG PO TABS: 12.5000 mg | ORAL_TABLET | Freq: Every day | ORAL | 1 refills | Status: DC

## 2024-04-27 NOTE — Assessment & Plan Note (Signed)
 Chronic, well controlled. Continue Lisinopril  and hydrochlorothiazide. Recommend heart healthy diet such as Mediterranean diet with whole grains, fruits, vegetable, fish, lean meats, nuts, and olive oil. Limit salt. Encouraged moderate walking, 3-5 times/week for 30-50 minutes each session. Aim for at least 150 minutes.week. Goal should be pace of 3 miles/hours, or walking 1.5 miles in 30 minutes. Avoid tobacco products. Avoid excess alcohol. Take medications as prescribed and bring medications and blood pressure log with cuff to each office visit. Seek medical care for chest pain, palpitations, shortness of breath with exertion, dizziness/lightheadedness, vision changes, recurrent headaches, or swelling of extremities. Follow up in 6 months

## 2024-04-27 NOTE — Progress Notes (Signed)
 Subjective:  HPI: Sheila Kerr is a 68 y.o. female presenting on 04/27/2024 for Medical Management of Chronic Issues (7 wk f/u /take over prescribing diclofenac sodium,Hydrochlorothiazide, lisinopril  please send to [facility] )   HPI Patient is in today for follow up for chronic conditions.   HTN: well controlled on hydrochlorothiazide 12.5mg  daily and Lisinopril  20mg  daily. Denies chest pain, palpitations, recurrent headaches, vision changes, lightheadedness, dizziness, dyspnea on exertion, or swelling of extremities.  Hypothyroidism: stable on Levothyroxine  112mcg daily, TSH 0.70, euthyroid, denies cold or heat intolerance, fatigue, weight gain/loss, palpitations, tremor, anxiety, constipation/diarrhea  HLD: stable on zetia 10mg  daily, heart healthy diet  CKD: in setting of antibiotics, followed by ID and podiatry, avoiding NSAIDs and pushing water  Review of Systems  All other systems reviewed and are negative.   Relevant past medical history reviewed and updated as indicated.   Past Medical History:  Diagnosis Date   Allergy    Arthritis    Asthma    Blood transfusion without reported diagnosis 1979   GERD (gastroesophageal reflux disease)    Heart murmur 2023   Hyperlipidemia    Hypertension    Thyroid  disease      Past Surgical History:  Procedure Laterality Date   ABDOMINAL HYSTERECTOMY     APPENDECTOMY  1988   FOOT SURGERY  08/26/2023   both foot one in 2023    Allergies and medications reviewed and updated.   Current Outpatient Medications:    albuterol (VENTOLIN HFA) 108 (90 Base) MCG/ACT inhaler, , Disp: , Rfl:    ascorbic acid  (VITAMIN C) 500 MG tablet, Take 500 mg by mouth daily., Disp: , Rfl:    Cholecalciferol (VITAMIN D3) 50 MCG (2000 UT) CHEW, Chew by mouth., Disp: , Rfl:    Collagen-Vitamin C-Biotin (COLLAGEN PO), Take 600 mg by mouth., Disp: , Rfl:    diclofenac (VOLTAREN) 50 MG EC tablet, Take 50 mg by mouth 2 (two) times  daily., Disp: , Rfl:    doxycycline  (VIBRA -TABS) 100 MG tablet, Take 1 tablet (100 mg total) by mouth 2 (two) times daily., Disp: 28 tablet, Rfl: 0   ezetimibe (ZETIA) 10 MG tablet, , Disp: , Rfl:    FLUTICASONE PROPIONATE HFA IN, Inhale into the lungs., Disp: , Rfl:    gentamicin  ointment (GARAMYCIN ) 0.1 %, Apply 1 Application topically daily. Apply to wound daily, Disp: 15 g, Rfl: 0   hydrochlorothiazide (HYDRODIURIL) 12.5 MG tablet, , Disp: , Rfl:    Lactobacillus (PROBIOTIC ACIDOPHILUS PO), Take by mouth., Disp: , Rfl:    levofloxacin  (LEVAQUIN ) 500 MG tablet, Take 1 tablet (500 mg total) by mouth daily., Disp: 30 tablet, Rfl: 2   levothyroxine  (SYNTHROID ) 112 MCG tablet, Take 1 tablet (112 mcg total) by mouth daily before breakfast., Disp: 90 tablet, Rfl: 1   lisinopril  (ZESTRIL ) 20 MG tablet, Take 1 tablet (20 mg total) by mouth daily., Disp: 90 tablet, Rfl: 0   Multiple Vitamins-Minerals (ALIVE DIABETIC MULTIVITAMIN PO), Take by mouth., Disp: , Rfl:    omeprazole (PRILOSEC) 40 MG capsule, SMARTSIG:1 Capsule(s) By Mouth Every Evening, Disp: , Rfl:    OVER THE COUNTER MEDICATION, Pt takes ALLERTEC, Disp: , Rfl:   Allergies  Allergen Reactions   Amoxicillin -Pot Clavulanate Nausea And Vomiting   Adhesive [Tape]    Latex Rash    Objective:   BP 121/70   Pulse (!) 106   Temp 98.2 F (36.8 C)   Ht 5' 6 (1.676 m)   Wt 211 lb (  95.7 kg)   SpO2 99%   BMI 34.06 kg/m      04/27/2024    2:37 PM 04/16/2024    9:07 AM 04/07/2024    1:34 PM  Vitals with BMI  Height 5' 6 5' 6 5' 6  Weight 211 lbs 221 lbs 221 lbs  BMI 34.07 35.69 35.69  Systolic 121    Diastolic 70    Pulse 106       Physical Exam Vitals and nursing note reviewed.  Constitutional:      Appearance: Normal appearance. She is normal weight.  HENT:     Head: Normocephalic and atraumatic.   Cardiovascular:     Rate and Rhythm: Normal rate and regular rhythm.     Pulses: Normal pulses.     Heart sounds: Normal  heart sounds.  Pulmonary:     Effort: Pulmonary effort is normal.     Breath sounds: Normal breath sounds.   Skin:    General: Skin is warm and dry.   Neurological:     General: No focal deficit present.     Mental Status: She is alert and oriented to person, place, and time. Mental status is at baseline.   Psychiatric:        Mood and Affect: Mood normal.        Behavior: Behavior normal.        Thought Content: Thought content normal.        Judgment: Judgment normal.     Assessment & Plan:  Primary hypertension Assessment & Plan: Chronic, well controlled. Continue Lisinopril  and hydrochlorothiazide. Recommend heart healthy diet such as Mediterranean diet with whole grains, fruits, vegetable, fish, lean meats, nuts, and olive oil. Limit salt. Encouraged moderate walking, 3-5 times/week for 30-50 minutes each session. Aim for at least 150 minutes.week. Goal should be pace of 3 miles/hours, or walking 1.5 miles in 30 minutes. Avoid tobacco products. Avoid excess alcohol. Take medications as prescribed and bring medications and blood pressure log with cuff to each office visit. Seek medical care for chest pain, palpitations, shortness of breath with exertion, dizziness/lightheadedness, vision changes, recurrent headaches, or swelling of extremities. Follow up in 6 months   Hypothyroidism, unspecified type Assessment & Plan: TSH 0.70. continue Synthroid  112mcg daily  The correct intake of thyroid  hormone (Levothyroxine , Synthroid ), is on empty stomach first thing in the morning, with water, separated by at least 30 minutes from breakfast and other medications,  and separated by more than 4 hours from calcium, iron, multivitamins, acid reflux medications (PPIs).   - This medication is a life-long medication and will be needed to correct thyroid  hormone imbalances for the rest of your life.  The dose may change from time to time, based on thyroid  blood work.   - It is extremely  important to be consistent taking this medication, near the same time each morning.   -AVOID TAKING PRODUCTS CONTAINING BIOTIN (commonly found in Hair, Skin, Nails vitamins) AS IT INTERFERES WITH THE VALIDITY OF THYROID  FUNCTION BLOOD TESTS.    Mixed hyperlipidemia Assessment & Plan: Intolerant of statins, continue Zetia 10mg  daily. Your labs showed elevated cholesterol. I recommend consuming a heart healthy diet such as Mediterranean diet or DASH diet with whole grains, fruits, vegetable, fish, lean meats, nuts, and olive oil. Limit sweets and processed foods. I also encourage moderate intensity exercise 150 minutes weekly. This is 3-5 times weekly for 30-50 minutes each session. Goal should be pace of 3 miles/hours, or walking 1.5 miles in  30 minutes. The 10-year ASCVD risk score (Arnett DK, et al., 2019) is: 10.2%    Stage 3a chronic kidney disease (HCC) Assessment & Plan: Antibiotics per ID, monitoring labs, push fluids, avoid NSAIDs      Follow up plan: Return in about 4 months (around 08/27/2024) for chronic follow-up with labs 1 week prior.  [name], FNP

## 2024-04-27 NOTE — Assessment & Plan Note (Signed)
 Intolerant of statins, continue Zetia 10mg  daily. Your labs showed elevated cholesterol. I recommend consuming a heart healthy diet such as Mediterranean diet or DASH diet with whole grains, fruits, vegetable, fish, lean meats, nuts, and olive oil. Limit sweets and processed foods. I also encourage moderate intensity exercise 150 minutes weekly. This is 3-5 times weekly for 30-50 minutes each session. Goal should be pace of 3 miles/hours, or walking 1.5 miles in 30 minutes. The 10-year ASCVD risk score (Arnett DK, et al., 2019) is: 10.2%

## 2024-04-27 NOTE — Assessment & Plan Note (Signed)
 Antibiotics per ID, monitoring labs, push fluids, avoid NSAIDs

## 2024-04-27 NOTE — Assessment & Plan Note (Signed)
 TSH 0.70. continue Synthroid  112mcg daily  The correct intake of thyroid  hormone (Levothyroxine , Synthroid ), is on empty stomach first thing in the morning, with water, separated by at least 30 minutes from breakfast and other medications,  and separated by more than 4 hours from calcium, iron, multivitamins, acid reflux medications (PPIs).   - This medication is a life-long medication and will be needed to correct thyroid  hormone imbalances for the rest of your life.  The dose may change from time to time, based on thyroid  blood work.   - It is extremely important to be consistent taking this medication, near the same time each morning.   -AVOID TAKING PRODUCTS CONTAINING BIOTIN (commonly found in Hair, Skin, Nails vitamins) AS IT INTERFERES WITH THE VALIDITY OF THYROID  FUNCTION BLOOD TESTS.

## 2024-04-28 ENCOUNTER — Ambulatory Visit: Admitting: Internal Medicine

## 2024-04-30 ENCOUNTER — Encounter: Payer: Self-pay | Admitting: Podiatry

## 2024-04-30 ENCOUNTER — Ambulatory Visit (INDEPENDENT_AMBULATORY_CARE_PROVIDER_SITE_OTHER): Admitting: Podiatry

## 2024-04-30 VITALS — Ht 66.0 in | Wt 211.0 lb

## 2024-04-30 DIAGNOSIS — M21962 Unspecified acquired deformity of left lower leg: Secondary | ICD-10-CM

## 2024-04-30 DIAGNOSIS — L97521 Non-pressure chronic ulcer of other part of left foot limited to breakdown of skin: Secondary | ICD-10-CM

## 2024-04-30 DIAGNOSIS — M86472 Chronic osteomyelitis with draining sinus, left ankle and foot: Secondary | ICD-10-CM

## 2024-04-30 NOTE — Progress Notes (Signed)
  Subjective:  Patient ID: Sheila Kerr, female    DOB: October 02, 1956,  MRN: [id number]  Chief Complaint  Patient presents with   Routine Post Op     POV # 2 DOS 04/10/24 LT GREAT TOE FUSION W/GRAFT pt states everything is going fine a little discomfort but no pain.     DOS: 04/10/2024 Procedure: Left first MTP fusion with autograft and allograft  68 y.o. female returns for post-op check.  Her foot is doing well.  Her husband suffered a stroke and is in the hospital  Review of Systems: Negative except as noted in the HPI. Denies N/V/F/Ch.   Objective:  There were no vitals filed for this visit. Body mass index is 34.06 kg/m. Constitutional Well developed. Well nourished.  Vascular Foot warm and well perfused. Capillary refill normal to all digits.  Calf is soft and supple, no posterior calf or knee pain, negative Homans' sign  Neurologic Normal speech. Oriented to person, place, and time. Epicritic sensation to light touch grossly present bilaterally.  Dermatologic Incision is well-healed.  There is no sign infection.  Minimal edema.  Plantar ulcer is healed  Orthopedic: Mild pain and edema to palpation noted about the surgical site.   Multiple view plain film radiographs: Good correction noted hardware intact and in equivalent postoperative position Assessment:   1. Ulcer of left foot, limited to breakdown of skin (HCC)   2. Deformity of metatarsal bone of left foot   3. 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  S/p foot surgery left - Sutures removed uneventfully.  Continues have CAM boot and knee scooter.  Okay to use transfers and ambulate with walker with pressure on heel and majority of weight on right foot on very short distances.  Okay to drive.  These accommodations are necessary due to her husband's recent stroke.  Return in 3 weeks for new radiographs.  No follow-ups on  file.

## 2024-05-21 ENCOUNTER — Ambulatory Visit (INDEPENDENT_AMBULATORY_CARE_PROVIDER_SITE_OTHER)

## 2024-05-21 ENCOUNTER — Ambulatory Visit (INDEPENDENT_AMBULATORY_CARE_PROVIDER_SITE_OTHER): Admitting: Podiatry

## 2024-05-21 VITALS — Ht 66.0 in | Wt 211.0 lb

## 2024-05-21 DIAGNOSIS — Q7022 Fused toes, left foot: Secondary | ICD-10-CM

## 2024-05-21 DIAGNOSIS — M205X2 Other deformities of toe(s) (acquired), left foot: Secondary | ICD-10-CM

## 2024-05-24 NOTE — Progress Notes (Signed)
  Subjective:  Patient ID: Sheila Kerr, female    DOB: 05/18/1956,  MRN: [id number]  Chief Complaint  Patient presents with   Post-op Follow-up    RM 6 POV # 3 DOS 04/10/24 LT GREAT TOE FUSION W/GRAFT. Patient states pain after long periods of activity. Patient is now using a walker to help with gait.    DOS: 04/10/2024 Procedure: Left first MTP fusion with autograft and allograft  68 y.o. female returns for post-op check.     Review of Systems: Negative except as noted in the HPI. Denies N/V/F/Ch.   Objective:  There were no vitals filed for this visit. Body mass index is 34.06 kg/m. Constitutional Well developed. Well nourished.  Vascular Foot warm and well perfused. Capillary refill normal to all digits.  Calf is soft and supple, no posterior calf or knee pain, negative Homans' sign  Neurologic Normal speech. Oriented to person, place, and time. Epicritic sensation to light touch grossly present bilaterally.  Dermatologic Incision is well-healed.  There is no sign infection.  She has no edema.  Plantar ulcer is healed  Orthopedic: No pain or edema.   Multiple view plain film radiographs: Good correction noted hardware intact and in equivalent postoperative position.  There is good early integration and consolidation across the graft site Assessment:   1. Hallux limitus, left    Plan:  Patient was evaluated and treated and all questions answered.  S/p foot surgery left - Can be full weightbearing and CAM boot heel to toenail.  Follow-up in 4 weeks and plan to transition to surgical shoe.  Return in about 4 weeks (around 06/18/2024) for surgery follow up (new xrays).

## 2024-06-18 ENCOUNTER — Ambulatory Visit (INDEPENDENT_AMBULATORY_CARE_PROVIDER_SITE_OTHER)

## 2024-06-18 ENCOUNTER — Ambulatory Visit (INDEPENDENT_AMBULATORY_CARE_PROVIDER_SITE_OTHER): Admitting: Podiatry

## 2024-06-18 VITALS — Ht 66.0 in | Wt 211.0 lb

## 2024-06-18 DIAGNOSIS — M205X2 Other deformities of toe(s) (acquired), left foot: Secondary | ICD-10-CM

## 2024-06-21 NOTE — Progress Notes (Signed)
  Subjective:  Patient ID: Sheila Kerr, female    DOB: 02-Feb-1956,  MRN: [id number]  Chief Complaint  Patient presents with   Post-op Follow-up    RM 19 POST OP - POV 4 Return in about 4 weeks (around 06/18/2024) for surgery follow up (new xrays).  Patient states no pain or discomfort. Patient has a  small cut on the fourth and fifth left toe.    DOS: 04/10/2024 Procedure: Left first MTP fusion with autograft and allograft  68 y.o. female returns for post-op check.     Review of Systems: Negative except as noted in the HPI. Denies N/V/F/Ch.   Objective:  There were no vitals filed for this visit. Body mass index is 34.06 kg/m. Constitutional Well developed. Well nourished.  Vascular Foot warm and well perfused. Capillary refill normal to all digits.  Calf is soft and supple, no posterior calf or knee pain, negative Homans' sign  Neurologic Normal speech. Oriented to person, place, and time. Epicritic sensation to light touch grossly present bilaterally.  Dermatologic Incision is well-healed.  Plantar ulcer remains healed with small blister.  Small cut adjacent to distal medial nail medial fifth toe  Orthopedic: No pain or edema.   Multiple view plain film radiographs: Correction is maintained she has excellent integration of the graft site Assessment:   1. Hallux limitus, left    Plan:  Patient was evaluated and treated and all questions answered.  S/p foot surgery left - May be able to transition back to regular shoe gear at this point.  Toe cut should heal uneventfully apply Neosporin daily.  She has previous insole she will return with at her next visit to reevaluate.  Return in about 1 month (around 07/19/2024) for Surgery follow-up and new left foot x-rays.

## 2024-07-10 ENCOUNTER — Other Ambulatory Visit: Payer: Self-pay | Admitting: Family Medicine

## 2024-07-30 ENCOUNTER — Other Ambulatory Visit: Payer: Self-pay | Admitting: Family Medicine

## 2024-07-30 ENCOUNTER — Ambulatory Visit (INDEPENDENT_AMBULATORY_CARE_PROVIDER_SITE_OTHER)

## 2024-07-30 ENCOUNTER — Ambulatory Visit (INDEPENDENT_AMBULATORY_CARE_PROVIDER_SITE_OTHER): Admitting: Podiatry

## 2024-07-30 VITALS — Ht 66.0 in | Wt 211.0 lb

## 2024-07-30 DIAGNOSIS — M205X2 Other deformities of toe(s) (acquired), left foot: Secondary | ICD-10-CM

## 2024-07-30 DIAGNOSIS — S92312A Displaced fracture of first metatarsal bone, left foot, initial encounter for closed fracture: Secondary | ICD-10-CM | POA: Diagnosis not present

## 2024-07-30 NOTE — Patient Instructions (Signed)
Call [facility] to schedule your CT at the below locations.  Please allow at least 1 business day after your visit to process the referral.  It may take longer depending on approval from insurance.  Please let me know if you have issues or problems scheduling the CT   [facility] [phone] [address] [place],  [address]  [facility] [place] [phone] [address] [place],  [address]  

## 2024-07-30 NOTE — Progress Notes (Signed)
  Subjective:  Patient ID: Sheila Kerr, female    DOB: 02/20/1956,  MRN: [id number]  Chief Complaint  Patient presents with   Post-op Follow-up    RM Pt is here 1 month surgery f/u.  Left foot and left index toe is swollen.    DOS: 04/10/2024 Procedure: Left first MTP fusion with autograft and allograft  68 y.o. female returns for post-op check.   Does not have any recollection of any traumatic event but was walking her dog on uneven terrain while visiting mother recently  Review of Systems: Negative except as noted in the HPI. Denies N/V/F/Ch.   Objective:  There were no vitals filed for this visit. Body mass index is 34.06 kg/m. Constitutional Well developed. Well nourished.  Vascular Foot warm and well perfused. Capillary refill normal to all digits.  Calf is soft and supple, no posterior calf or knee pain, negative Homans' sign  Neurologic Normal speech. Oriented to person, place, and time. Epicritic sensation to light touch grossly present bilaterally.  Dermatologic Incision is well-healed and not hypertrophic  Orthopedic: Significant edema on the left midfoot with tenderness to palpation   Multiple view plain film radiographs: There is a new periprosthetic fracture proximal to the MTP plate in the first metatarsal with dorsal elevation minimal transverse or frontal plane deformity Assessment:   1. Closed displaced fracture of first metatarsal bone of left foot, initial encounter    Plan:  Patient was evaluated and treated and all questions answered.  S/p foot surgery left - We reviewed her x-rays.  Unfortunately has developed periprosthetic fracture likely a stress riser between the hardware that was previously implanted and a new hardware.  I discussed with her with the dorsal translation and this likely she would benefit best from operative treatment and treated as a neuropathic fracture with beaming and cage plating.  Currently her duties at home including taking  care of her pets and animals and with the recent loss of her husband she does not have much home support that we will allow her to be nonweightbearing at this point.  I recommended a CT to reevaluate the alignment and extent of the fractures, if we start seeing secondary bone healing may allow this to heal in a malunited position and see how she does I discussed with him my concern that this is transitioning weight to the 2nd and 3rd metatarsals leading to stress fractures or fractures.  She will follow-up with me after the CT scan to review.  Return in about 12 days (around 08/11/2024) for CT review.

## 2024-07-31 ENCOUNTER — Ambulatory Visit
Admission: RE | Admit: 2024-07-31 | Discharge: 2024-07-31 | Disposition: A | Discharge status: Home / Self Care | Source: Ambulatory Visit | Attending: Podiatry | Admitting: Podiatry

## 2024-07-31 ENCOUNTER — Encounter: Payer: Self-pay | Admitting: Podiatry

## 2024-07-31 DIAGNOSIS — M19072 Primary osteoarthritis, left ankle and foot: Secondary | ICD-10-CM | POA: Diagnosis not present

## 2024-07-31 DIAGNOSIS — S92312A Displaced fracture of first metatarsal bone, left foot, initial encounter for closed fracture: Secondary | ICD-10-CM | POA: Diagnosis not present

## 2024-08-02 ENCOUNTER — Ambulatory Visit: Payer: Self-pay | Admitting: Podiatry

## 2024-08-11 ENCOUNTER — Ambulatory Visit: Admitting: Podiatry

## 2024-08-11 VITALS — Ht 66.0 in | Wt 211.0 lb

## 2024-08-11 DIAGNOSIS — S92312A Displaced fracture of first metatarsal bone, left foot, initial encounter for closed fracture: Secondary | ICD-10-CM | POA: Diagnosis not present

## 2024-08-11 NOTE — Progress Notes (Signed)
  Subjective:  Patient ID: Sheila Kerr, female    DOB: [birth year],  MRN: [id number]  Chief Complaint  Patient presents with   Fracture    Rm 2 Return in about 12 days (around 08/11/2024) for CT review.       DOS: 04/10/2024 Procedure: Left first MTP fusion with autograft and allograft  68 y.o. female returns for post-op check.  She returns for follow-up feels better after wearing the cam boot has been compliant with this and she completed the CT scan  Review of Systems: Negative except as noted in the HPI. Denies N/V/F/Ch.   Objective:  There were no vitals filed for this visit. Body mass index is 34.06 kg/m. Constitutional Well developed. Well nourished.  Vascular Foot warm and well perfused. Capillary refill normal to all digits.  Calf is soft and supple, no posterior calf or knee pain, negative Homans' sign  Neurologic Normal speech. Oriented to person, place, and time. Epicritic sensation to light touch grossly present bilaterally.  Dermatologic Incision is well-healed and not hypertrophic  Orthopedic: Improvement in edema on the left midfoot with no tenderness to palpation   Multiple view plain film radiographs: There is a new periprosthetic fracture proximal to the MTP plate in the first metatarsal with dorsal elevation minimal transverse or frontal plane deformity  IMPRESSION: 1. Transverse fracture of the midshaft of the first metatarsal with 3 mm of medial displacement and 5.5 mm dorsal displacement of the distal fracture fragment with respect to the proximal. 2. Previous solid fusion of the base of the first metatarsal with the medial cuneiform and first MTP joint. 3. 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 4. Thickened Achilles tendon with small internal ossific structures, appearance favors Achilles tendinopathy. 5. Thickened plantar fascia proximally favoring plantar fasciitis. 6. Notable  subcutaneous edema plantar to the fused fifth MTP joint. Cannot exclude ulceration along the ball of the foot in this vicinity. 7. Miniscule loculation of gas density dorsal to the proximal second metatarsal, possibly a small focus of venous gas or nitrogen gas formation. Do not see the degree of surrounding inflammation that I would expect for an abscess.     Electronically Signed   By: Ryan Salvage M.D.   On: 07/31/2024 16:42 Assessment:   1. Closed displaced fracture of first metatarsal bone of left foot, initial encounter    Plan:  Patient was evaluated and treated and all questions answered.  S/p foot surgery left - We reviewed her CT scan results.  There is displacement of the fracture fragment but her transverse alignment is relatively well aligned.  We discussed treatment of this again.  Realistically surgical invention and nonweightbearing is not an option for her due to her duties and ADLs at home.  We discussed closed treatment with a weightbearing protected total contact cast change weekly.  We discussed the risk and benefits of this we also discussed the possibility of nonunion with this treatment this still may require surgery at a later date.  She understands and wishes to proceed.  Closed fracture treatment with casting was completed with a well-padded below the knee total contact cast applied with stockinette cast padding and fiberglass cast roll.  A cast boot was applied for this.  She will follow-up me in 1 week for cast change and then we will take new radiographs in mid October.  Return in about 1 week (around 08/18/2024) for total contact casting.

## 2024-08-17 ENCOUNTER — Other Ambulatory Visit

## 2024-08-17 DIAGNOSIS — E039 Hypothyroidism, unspecified: Secondary | ICD-10-CM

## 2024-08-17 DIAGNOSIS — E782 Mixed hyperlipidemia: Secondary | ICD-10-CM | POA: Diagnosis not present

## 2024-08-17 DIAGNOSIS — N1831 Chronic kidney disease, stage 3a: Secondary | ICD-10-CM | POA: Diagnosis not present

## 2024-08-17 DIAGNOSIS — I10 Essential (primary) hypertension: Secondary | ICD-10-CM

## 2024-08-18 ENCOUNTER — Ambulatory Visit: Admitting: Podiatry

## 2024-08-18 VITALS — Ht 66.0 in | Wt 211.0 lb

## 2024-08-18 DIAGNOSIS — S92312D Displaced fracture of first metatarsal bone, left foot, subsequent encounter for fracture with routine healing: Secondary | ICD-10-CM | POA: Diagnosis not present

## 2024-08-18 LAB — COMPREHENSIVE METABOLIC PANEL WITH GFR
AG Ratio: 1.6 (calc) (ref 1.0–2.5)
ALT: 14 U/L (ref 6–29)
AST: 13 U/L (ref 10–35)
Albumin: 4.4 g/dL (ref 3.6–5.1)
Alkaline phosphatase (APISO): 107 U/L (ref 37–153)
BUN: 19 mg/dL (ref 7–25)
CO2: 28 mmol/L (ref 20–32)
Calcium: 9.7 mg/dL (ref 8.6–10.4)
Chloride: 103 mmol/L (ref 98–110)
Creat: 0.78 mg/dL (ref 0.50–1.05)
Globulin: 2.8 g/dL (ref 1.9–3.7)
Glucose, Bld: 93 mg/dL (ref 65–99)
Potassium: 3.8 mmol/L (ref 3.5–5.3)
Sodium: 140 mmol/L (ref 135–146)
Total Bilirubin: 0.4 mg/dL (ref 0.2–1.2)
Total Protein: 7.2 g/dL (ref 6.1–8.1)
eGFR: 82 mL/min/1.73m2 (ref >=60)

## 2024-08-18 LAB — CBC WITH DIFFERENTIAL/PLATELET
Absolute Lymphocytes: 1201 {cells}/uL (ref 850–3900)
Absolute Monocytes: 333 {cells}/uL (ref 200–950)
Basophils Absolute: 29 {cells}/uL (ref 0–200)
Basophils Relative: 0.6 %
Eosinophils Absolute: 152 {cells}/uL (ref 15–500)
Eosinophils Relative: 3.1 %
HCT: 35.9 % (ref 35.0–45.0)
Hemoglobin: 12.1 g/dL (ref 11.7–15.5)
MCH: 30.1 pg (ref 27.0–33.0)
MCHC: 33.7 g/dL (ref 32.0–36.0)
MCV: 89.3 fL (ref 80.0–100.0)
MPV: 10.2 fL (ref 7.5–12.5)
Monocytes Relative: 6.8 %
Neutro Abs: 3185 {cells}/uL (ref 1500–7800)
Neutrophils Relative %: 65 %
Platelets: 201 Thousand/uL (ref 140–400)
RBC: 4.02 Million/uL (ref 3.80–5.10)
RDW: 13.4 % (ref 11.0–15.0)
Total Lymphocyte: 24.5 %
WBC: 4.9 Thousand/uL (ref 3.8–10.8)

## 2024-08-18 LAB — LIPID PANEL
Cholesterol: 195 mg/dL (ref <200)
HDL: 45 mg/dL — ABNORMAL LOW (ref >=50)
LDL Cholesterol (Calc): 115 mg/dL — ABNORMAL HIGH
Non-HDL Cholesterol (Calc): 150 mg/dL — ABNORMAL HIGH (ref <130)
Total CHOL/HDL Ratio: 4.3 (calc) (ref <5.0)
Triglycerides: 234 mg/dL — ABNORMAL HIGH (ref <150)

## 2024-08-18 LAB — TSH: TSH: 1.54 m[IU]/L (ref 0.40–4.50)

## 2024-08-18 NOTE — Progress Notes (Signed)
  Subjective:  Patient ID: Sheila Kerr, female    DOB: 08-22-1956,  MRN: [id number]  No chief complaint on file.   DOS: 04/10/2024 Procedure: Left first MTP fusion with autograft and allograft  68 y.o. female returns for post-op check.  She returns for follow-up casted become somewhat loose  Review of Systems: Negative except as noted in the HPI. Denies N/V/F/Ch.   Objective:  There were no vitals filed for this visit. Body mass index is 34.06 kg/m. Constitutional Well developed. Well nourished.  Vascular Foot warm and well perfused. Capillary refill normal to all digits.  Calf is soft and supple, no posterior calf or knee pain, negative Homans' sign  Neurologic Normal speech. Oriented to person, place, and time. Epicritic sensation to light touch grossly present bilaterally.  Dermatologic Wound is healed plantar.  Small skin abrasion from cast removal medial foot  Orthopedic: Improvement in edema on the left midfoot with no tenderness to palpation   Multiple view plain film radiographs: There is a new periprosthetic fracture proximal to the MTP plate in the first metatarsal with dorsal elevation minimal transverse or frontal plane deformity  IMPRESSION: 1. Transverse fracture of the midshaft of the first metatarsal with 3 mm of medial displacement and 5.5 mm dorsal displacement of the distal fracture fragment with respect to the proximal. 2. Previous solid fusion of the base of the first metatarsal with the medial cuneiform and first MTP joint. 3. 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 4. Thickened Achilles tendon with small internal ossific structures, appearance favors Achilles tendinopathy. 5. Thickened plantar fascia proximally favoring plantar fasciitis. 6. Notable subcutaneous edema plantar to the fused fifth MTP joint. Cannot exclude ulceration along the ball of the foot in this vicinity. 7. Miniscule  loculation of gas density dorsal to the proximal second metatarsal, possibly a small focus of venous gas or nitrogen gas formation. Do not see the degree of surrounding inflammation that I would expect for an abscess.     Electronically Signed   By: Ryan Salvage M.D.   On: 07/31/2024 16:42 Assessment:   1. Closed displaced fracture of first metatarsal bone of left foot with routine healing, subsequent encounter    Plan:  Patient was evaluated and treated and all questions answered.  S/p foot surgery left - Total contact cast was removed.  Had a small skin abrasion from the cast saw on the left medial foot.  Did not disrupt dermis.  Applied Silvadene.  Well-padded total contact below the knee fiberglass cast was reapplied.  Follow-up in 1 week for cast change.  Plan for x-rays in 2 weeks.  No follow-ups on file.

## 2024-08-22 ENCOUNTER — Other Ambulatory Visit: Payer: Self-pay | Admitting: Family Medicine

## 2024-08-24 ENCOUNTER — Ambulatory Visit: Payer: Self-pay | Admitting: Family Medicine

## 2024-08-25 ENCOUNTER — Ambulatory Visit: Admitting: Podiatry

## 2024-08-25 ENCOUNTER — Ambulatory Visit (INDEPENDENT_AMBULATORY_CARE_PROVIDER_SITE_OTHER)

## 2024-08-25 VITALS — Ht 66.0 in | Wt 211.0 lb

## 2024-08-25 DIAGNOSIS — S92312D Displaced fracture of first metatarsal bone, left foot, subsequent encounter for fracture with routine healing: Secondary | ICD-10-CM | POA: Diagnosis not present

## 2024-08-25 NOTE — Progress Notes (Signed)
  Subjective:  Patient ID: Sheila Kerr, female    DOB: 1956-08-12,  MRN: [id number]  Chief Complaint  Patient presents with   Fracture    Rm 1 Patient is here for casting of left foot.    DOS: 04/10/2024 Procedure: Left first MTP fusion with autograft and allograft  68 y.o. female returns for post-op check.  She returns for follow-up  Review of Systems: Negative except as noted in the HPI. Denies N/V/F/Ch.   Objective:  There were no vitals filed for this visit. Body mass index is 34.06 kg/m. Constitutional Well developed. Well nourished.  Vascular Foot warm and well perfused. Capillary refill normal to all digits.  Calf is soft and supple, no posterior calf or knee pain, negative Homans' sign  Neurologic Normal speech. Oriented to person, place, and time. Epicritic sensation to light touch grossly present bilaterally.  Dermatologic Wound is healed plantar.  Prior skin abrasion is healed  Orthopedic: Improvement in edema on the left midfoot with no tenderness to palpation   Multiple view plain film radiographs: New radiographs taken today show some increased elevation of the distal fracture fragment, but there is good bone callus forming around the fracture site.  IMPRESSION: 1. Transverse fracture of the midshaft of the first metatarsal with 3 mm of medial displacement and 5.5 mm dorsal displacement of the distal fracture fragment with respect to the proximal. 2. Previous solid fusion of the base of the first metatarsal with the medial cuneiform and first MTP joint. 3. 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 4. Thickened Achilles tendon with small internal ossific structures, appearance favors Achilles tendinopathy. 5. Thickened plantar fascia proximally favoring plantar fasciitis. 6. Notable subcutaneous edema plantar to the fused fifth MTP joint. Cannot exclude ulceration along the ball of the foot in  this vicinity. 7. Miniscule loculation of gas density dorsal to the proximal second metatarsal, possibly a small focus of venous gas or nitrogen gas formation. Do not see the degree of surrounding inflammation that I would expect for an abscess.     Electronically Signed   By: Ryan Salvage M.D.   On: 07/31/2024 16:42 Assessment:   1. Closed displaced fracture of first metatarsal bone of left foot with routine healing, subsequent encounter    Plan:  Patient was evaluated and treated and all questions answered.  S/p foot surgery left - Total contact cast was removed.  We reviewed her radiographs.  We discussed it does show some elevation of the fracture fragment.  ORIF for her is still not an option, there is bone callus forming and we discussed letting this heal even in a malunited position and deal with aftereffects at a later date which may include metatarsal head resections and/or MIS osteotomy to treat any transfer metatarsalgia. Well-padded total contact below the knee fiberglass cast was reapplied.  Follow-up in 1 week for cast change.  Plan for x-rays in 4 more weeks.  No follow-ups on file.

## 2024-08-27 ENCOUNTER — Encounter: Payer: Self-pay | Admitting: Family Medicine

## 2024-08-27 ENCOUNTER — Ambulatory Visit: Admitting: Family Medicine

## 2024-08-27 VITALS — BP 124/78 | HR 84 | Temp 98.3°F | Ht 66.0 in | Wt 210.0 lb

## 2024-08-27 DIAGNOSIS — I10 Essential (primary) hypertension: Secondary | ICD-10-CM | POA: Diagnosis not present

## 2024-08-27 DIAGNOSIS — Z780 Asymptomatic menopausal state: Secondary | ICD-10-CM

## 2024-08-27 DIAGNOSIS — N1831 Chronic kidney disease, stage 3a: Secondary | ICD-10-CM | POA: Diagnosis not present

## 2024-08-27 DIAGNOSIS — Z79899 Other long term (current) drug therapy: Secondary | ICD-10-CM

## 2024-08-27 DIAGNOSIS — Z13820 Encounter for screening for osteoporosis: Secondary | ICD-10-CM

## 2024-08-27 DIAGNOSIS — K219 Gastro-esophageal reflux disease without esophagitis: Secondary | ICD-10-CM

## 2024-08-27 DIAGNOSIS — E039 Hypothyroidism, unspecified: Secondary | ICD-10-CM

## 2024-08-27 DIAGNOSIS — E782 Mixed hyperlipidemia: Secondary | ICD-10-CM

## 2024-08-27 DIAGNOSIS — Z23 Encounter for immunization: Secondary | ICD-10-CM | POA: Diagnosis not present

## 2024-08-27 NOTE — Assessment & Plan Note (Signed)
 Kidney function improved. Continue to avoid NSAIDs and stay hydrated

## 2024-08-27 NOTE — Assessment & Plan Note (Addendum)
 Intolerant of statins, continue Zetia 10mg  daily. Discussed LDL goal <100. She would like to work on diet and exercise at this time. Discussed red yeast rice, Berberine, Omega 3. I recommend consuming a heart healthy diet such as Mediterranean diet or DASH diet with whole grains, fruits, vegetable, fish, lean meats, nuts, and olive oil. Limit sweets and processed foods. I also encourage moderate intensity exercise 150 minutes weekly. This is 3-5 times weekly for 30-50 minutes each session. Goal should be pace of 3 miles/hours, or walking 1.5 miles in 30 minutes. The 10-year ASCVD risk score (Arnett DK, et al., 2019) is: 11.1%

## 2024-08-27 NOTE — Assessment & Plan Note (Signed)
 Chronic, well controlled. Continue Lisinopril  and hydrochlorothiazide. Recommend heart healthy diet such as Mediterranean diet with whole grains, fruits, vegetable, fish, lean meats, nuts, and olive oil. Limit salt. Encouraged moderate walking, 3-5 times/week for 30-50 minutes each session. Aim for at least 150 minutes.week. Goal should be pace of 3 miles/hours, or walking 1.5 miles in 30 minutes. Avoid tobacco products. Avoid excess alcohol. Take medications as prescribed and bring medications and blood pressure log with cuff to each office visit. Seek medical care for chest pain, palpitations, shortness of breath with exertion, dizziness/lightheadedness, vision changes, recurrent headaches, or swelling of extremities. Follow up in 6 months

## 2024-08-27 NOTE — Assessment & Plan Note (Signed)
 Chronic well controlled on Omeprazole 40mg  daily. Elevated HOB if needed and avoid lying down 2-3 hours after eating, avoid coffee, alcohol, chocolate, fatty foods, citrus, carbonated beverages, spicy foods, late meals, and smoking. Return to office if symptoms return or worsen and seek medical care for difficulty swallowing, bleeding, anemia, weight loss, recurrent vomiting

## 2024-08-27 NOTE — Assessment & Plan Note (Signed)
 Continue Synthroid  112mcg daily  The correct intake of thyroid  hormone (Levothyroxine , Synthroid ), is on empty stomach first thing in the morning, with water, separated by at least 30 minutes from breakfast and other medications,  and separated by more than 4 hours from calcium, iron, multivitamins, acid reflux medications (PPIs).   - This medication is a life-long medication and will be needed to correct thyroid  hormone imbalances for the rest of your life.  The dose may change from time to time, based on thyroid  blood work.   - It is extremely important to be consistent taking this medication, near the same time each morning.   -AVOID TAKING PRODUCTS CONTAINING BIOTIN (commonly found in Hair, Skin, Nails vitamins) AS IT INTERFERES WITH THE VALIDITY OF THYROID  FUNCTION BLOOD TESTS.

## 2024-08-27 NOTE — Progress Notes (Signed)
 Subjective:  HPI: Sheila Kerr is a 68 y.o. female presenting on 08/27/2024 for Medical Management of Chronic Issues (4 month f/u flu shot today )   HPI Patient is in today for chronic condition management. PMH includes HTN, hypothyroidism, HLD, CKD, GERD, Asthma Ms Hass has been suffering this past year from a left foot fracture that she recently reinjured. She has also lost her husband this year sadly. Admits to a poorer than usual diet and obvious exercise limitations due to orthopedic injury.  Asthma: albuterol PRN  GERD: on Omperazole 40mg  daily Denies hematemesis, melena, hematochezia, occult blood in stool, iron deficiency anemia, anorexia, unexplained weight loss, dysphagia, odynophagia, persistent vomiting  CKD: avoiding NSAIDs, staying hydrated Lab Results  Component Value Date   CREATININE 0.78 08/17/2024   CREATININE 1.09 (H) 04/22/2024   CREATININE 1.06 (H) 02/27/2024    HTN: on hydrochlorothiazide  12.5mg  daily and lisinopril  20mg  daily Denies chest pain, palpitations, recurrent headaches, vision changes, lightheadedness, dizziness, dyspnea on exertion, or swelling of extremities.  HLD: on zetia 10mg  daily, intolerant of statins Lipid Panel     Component Value Date/Time   CHOL 195 08/17/2024 0929   TRIG 234 (H) 08/17/2024 0929   HDL 45 (L) 08/17/2024 0929   CHOLHDL 4.3 08/17/2024 0929   LDLCALC 115 (H) 08/17/2024 0929   The 10-year ASCVD risk score (Arnett DK, et al., 2019) is: 11.1%   Values used to calculate the score:     Age: 10 years     Clincally relevant sex: Female     Is Non-Hispanic African American: No     Diabetic: No     Tobacco smoker: No     Systolic Blood Pressure: 124 mmHg     Is BP treated: Yes     HDL Cholesterol: 45 mg/dL     Total Cholesterol: 195 mg/dL  Hypothyroidism: on Synthroid  112mcg daily, TSH 1.54 Denies fatigue, cold or heat intolerance, weight gain or loss, constipation, diarrhea, palpitations, lower extremity  edema, anxiety/depression    Review of Systems  All other systems reviewed and are negative.   Relevant past medical history reviewed and updated as indicated.   Past Medical History:  Diagnosis Date   Allergy    Arthritis    Asthma    Blood transfusion without reported diagnosis 1979   GERD (gastroesophageal reflux disease) 2022   Heart murmur 2023   Hyperlipidemia    Hypertension 2007   Thyroid  disease 2007     Past Surgical History:  Procedure Laterality Date   ABDOMINAL HYSTERECTOMY  1988   APPENDECTOMY  1988   FOOT SURGERY  08/26/2023   both foot one in 2023    Allergies and medications reviewed and updated.   Current Outpatient Medications:    albuterol (VENTOLIN HFA) 108 (90 Base) MCG/ACT inhaler, , Disp: , Rfl:    ascorbic acid  (VITAMIN C) 500 MG tablet, Take 500 mg by mouth daily., Disp: , Rfl:    Cholecalciferol (VITAMIN D3) 50 MCG (2000 UT) CHEW, Chew by mouth., Disp: , Rfl:    diclofenac  (VOLTAREN ) 50 MG EC tablet, TAKE 1 TABLET TWICE DAILY, Disp: 180 tablet, Rfl: 3   ezetimibe (ZETIA) 10 MG tablet, , Disp: , Rfl:    FLUTICASONE PROPIONATE HFA IN, Inhale into the lungs., Disp: , Rfl:    hydrochlorothiazide  (HYDRODIURIL ) 12.5 MG tablet, Take 1 tablet (12.5 mg total) by mouth daily., Disp: 90 tablet, Rfl: 1   levothyroxine  (SYNTHROID ) 112 MCG tablet, TAKE 1 TABLET EVERY DAY  BEFORE BREAKFAST, Disp: 30 tablet, Rfl: 11   lisinopril  (ZESTRIL ) 20 MG tablet, Take 1 tablet (20 mg total) by mouth daily., Disp: 90 tablet, Rfl: 1   Multiple Vitamins-Minerals (ALIVE DIABETIC MULTIVITAMIN PO), Take by mouth., Disp: , Rfl:    omeprazole (PRILOSEC) 40 MG capsule, SMARTSIG:1 Capsule(s) By Mouth Every Evening, Disp: , Rfl:    OVER THE COUNTER MEDICATION, Pt takes ALLERTEC, Disp: , Rfl:   Allergies  Allergen Reactions   Amoxicillin -Pot Clavulanate Nausea And Vomiting   Adhesive [Tape]    Latex Rash    Objective:   BP 124/78   Pulse 84   Temp 98.3 F (36.8 C)   Ht  5' 6 (1.676 m)   Wt 210 lb (95.3 kg)   SpO2 98%   BMI 33.89 kg/m      08/27/2024   10:48 AM 08/27/2024   10:16 AM 08/25/2024   11:39 AM  Vitals with BMI  Height  5' 6 5' 6  Weight  210 lbs 211 lbs  BMI  33.91 34.07  Systolic 124 124   Diastolic 78 82   Pulse  84      Physical Exam Vitals and nursing note reviewed.  Constitutional:      Appearance: Normal appearance. She is obese.  HENT:     Head: Normocephalic and atraumatic.  Cardiovascular:     Rate and Rhythm: Normal rate and regular rhythm.     Pulses: Normal pulses.     Heart sounds: Normal heart sounds.  Pulmonary:     Effort: Pulmonary effort is normal.     Breath sounds: Normal breath sounds.  Skin:    General: Skin is warm and dry.  Neurological:     General: No focal deficit present.     Mental Status: She is alert and oriented to person, place, and time. Mental status is at baseline.  Psychiatric:        Mood and Affect: Mood normal.        Behavior: Behavior normal.        Thought Content: Thought content normal.        Judgment: Judgment normal.     Assessment & Plan:  Primary hypertension Assessment & Plan: Chronic, well controlled. Continue Lisinopril  and hydrochlorothiazide . Recommend heart healthy diet such as Mediterranean diet with whole grains, fruits, vegetable, fish, lean meats, nuts, and olive oil. Limit salt. Encouraged moderate walking, 3-5 times/week for 30-50 minutes each session. Aim for at least 150 minutes.week. Goal should be pace of 3 miles/hours, or walking 1.5 miles in 30 minutes. Avoid tobacco products. Avoid excess alcohol. Take medications as prescribed and bring medications and blood pressure log with cuff to each office visit. Seek medical care for chest pain, palpitations, shortness of breath with exertion, dizziness/lightheadedness, vision changes, recurrent headaches, or swelling of extremities. Follow up in 6 months   Gastroesophageal reflux disease without  esophagitis Assessment & Plan: Chronic well controlled on Omeprazole 40mg  daily. Elevated HOB if needed and avoid lying down 2-3 hours after eating, avoid coffee, alcohol, chocolate, fatty foods, citrus, carbonated beverages, spicy foods, late meals, and smoking. Return to office if symptoms return or worsen and seek medical care for difficulty swallowing, bleeding, anemia, weight loss, recurrent vomiting     Hypothyroidism, unspecified type Assessment & Plan: Continue Synthroid  112mcg daily  The correct intake of thyroid  hormone (Levothyroxine , Synthroid ), is on empty stomach first thing in the morning, with water, separated by at least 30 minutes from breakfast and other medications,  and separated by more than 4 hours from calcium, iron, multivitamins, acid reflux medications (PPIs).   - This medication is a life-long medication and will be needed to correct thyroid  hormone imbalances for the rest of your life.  The dose may change from time to time, based on thyroid  blood work.   - It is extremely important to be consistent taking this medication, near the same time each morning.   -AVOID TAKING PRODUCTS CONTAINING BIOTIN (commonly found in Hair, Skin, Nails vitamins) AS IT INTERFERES WITH THE VALIDITY OF THYROID  FUNCTION BLOOD TESTS.    Stage 3a chronic kidney disease (HCC) Assessment & Plan: Kidney function improved. Continue to avoid NSAIDs and stay hydrated   Mixed hyperlipidemia Assessment & Plan: Intolerant of statins, continue Zetia 10mg  daily. Discussed LDL goal <100. She would like to work on diet and exercise at this time. Discussed red yeast rice, Berberine, Omega 3. I recommend consuming a heart healthy diet such as Mediterranean diet or DASH diet with whole grains, fruits, vegetable, fish, lean meats, nuts, and olive oil. Limit sweets and processed foods. I also encourage moderate intensity exercise 150 minutes weekly. This is 3-5 times weekly for 30-50 minutes each  session. Goal should be pace of 3 miles/hours, or walking 1.5 miles in 30 minutes. The 10-year ASCVD risk score (Arnett DK, et al., 2019) is: 11.1%    Encounter for osteoporosis screening in asymptomatic postmenopausal patient -     DG Bone Density; Future  Medication management -     Magnesium     Follow up plan: Return in about 6 months (around 02/25/2025) for annual physical with labs 1 week prior.  Jeoffrey GORMAN Barrio, FNP

## 2024-09-01 ENCOUNTER — Ambulatory Visit: Admitting: Podiatry

## 2024-09-01 DIAGNOSIS — S92312D Displaced fracture of first metatarsal bone, left foot, subsequent encounter for fracture with routine healing: Secondary | ICD-10-CM

## 2024-09-04 DIAGNOSIS — J302 Other seasonal allergic rhinitis: Secondary | ICD-10-CM | POA: Diagnosis not present

## 2024-09-04 DIAGNOSIS — M2020 Hallux rigidus, unspecified foot: Secondary | ICD-10-CM | POA: Diagnosis not present

## 2024-09-04 DIAGNOSIS — M1990 Unspecified osteoarthritis, unspecified site: Secondary | ICD-10-CM | POA: Diagnosis not present

## 2024-09-04 DIAGNOSIS — I129 Hypertensive chronic kidney disease with stage 1 through stage 4 chronic kidney disease, or unspecified chronic kidney disease: Secondary | ICD-10-CM | POA: Diagnosis not present

## 2024-09-04 DIAGNOSIS — Z823 Family history of stroke: Secondary | ICD-10-CM | POA: Diagnosis not present

## 2024-09-04 DIAGNOSIS — E785 Hyperlipidemia, unspecified: Secondary | ICD-10-CM | POA: Diagnosis not present

## 2024-09-04 DIAGNOSIS — N1831 Chronic kidney disease, stage 3a: Secondary | ICD-10-CM | POA: Diagnosis not present

## 2024-09-04 DIAGNOSIS — M2450 Contracture, unspecified joint: Secondary | ICD-10-CM | POA: Diagnosis not present

## 2024-09-04 DIAGNOSIS — M2040 Other hammer toe(s) (acquired), unspecified foot: Secondary | ICD-10-CM | POA: Diagnosis not present

## 2024-09-04 DIAGNOSIS — K219 Gastro-esophageal reflux disease without esophagitis: Secondary | ICD-10-CM | POA: Diagnosis not present

## 2024-09-04 DIAGNOSIS — Q7020 Fused toes, unspecified foot: Secondary | ICD-10-CM | POA: Diagnosis not present

## 2024-09-04 DIAGNOSIS — Z8249 Family history of ischemic heart disease and other diseases of the circulatory system: Secondary | ICD-10-CM | POA: Diagnosis not present

## 2024-09-04 DIAGNOSIS — M2010 Hallux valgus (acquired), unspecified foot: Secondary | ICD-10-CM | POA: Diagnosis not present

## 2024-09-04 DIAGNOSIS — E039 Hypothyroidism, unspecified: Secondary | ICD-10-CM | POA: Diagnosis not present

## 2024-09-06 NOTE — Progress Notes (Signed)
  Subjective:  Patient ID: Sheila Kerr, female    DOB: 04/04/1956,  MRN: [id number]  No chief complaint on file.   DOS: 04/10/2024 Procedure: Left first MTP fusion with autograft and allograft  68 y.o. female returns for post-op check.  She returns for follow-up, overall is doing well  Review of Systems: Negative except as noted in the HPI. Denies N/V/F/Ch.   Objective:  There were no vitals filed for this visit. There is no height or weight on file to calculate BMI. Constitutional Well developed. Well nourished.  Vascular Foot warm and well perfused. Capillary refill normal to all digits.  Calf is soft and supple, no posterior calf or knee pain, negative Homans' sign  Neurologic Normal speech. Oriented to person, place, and time. Epicritic sensation to light touch grossly present bilaterally.  Dermatologic Wound is healed plantar.  Prior skin abrasion is healed  Orthopedic: Improvement in edema on the left midfoot with no tenderness to palpation   Multiple view plain film radiographs: New radiographs taken today show some increased elevation of the distal fracture fragment, but there is good bone callus forming around the fracture site.  IMPRESSION: 1. Transverse fracture of the midshaft of the first metatarsal with 3 mm of medial displacement and 5.5 mm dorsal displacement of the distal fracture fragment with respect to the proximal. 2. Previous solid fusion of the base of the first metatarsal with the medial cuneiform and first MTP joint. 3. 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 4. Thickened Achilles tendon with small internal ossific structures, appearance favors Achilles tendinopathy. 5. Thickened plantar fascia proximally favoring plantar fasciitis. 6. Notable subcutaneous edema plantar to the fused fifth MTP joint. Cannot exclude ulceration along the ball of the foot in this vicinity. 7. Miniscule  loculation of gas density dorsal to the proximal second metatarsal, possibly a small focus of venous gas or nitrogen gas formation. Do not see the degree of surrounding inflammation that I would expect for an abscess.     Electronically Signed   By: Ryan Salvage M.D.   On: 07/31/2024 16:42 Assessment:   1. Closed displaced fracture of first metatarsal bone of left foot with routine healing, subsequent encounter     Plan:  Patient was evaluated and treated and all questions answered.  S/p foot surgery left - Total contact cast changed today well-padded total contact below the knee fiberglass cast was reapplied.  Follow-up in 1 week for cast change.  Plan for x-rays in 3 more weeks.  No follow-ups on file.

## 2024-09-08 ENCOUNTER — Ambulatory Visit (INDEPENDENT_AMBULATORY_CARE_PROVIDER_SITE_OTHER)

## 2024-09-08 ENCOUNTER — Ambulatory Visit: Admitting: Podiatry

## 2024-09-08 VITALS — Ht 66.0 in | Wt 210.0 lb

## 2024-09-08 DIAGNOSIS — S92312D Displaced fracture of first metatarsal bone, left foot, subsequent encounter for fracture with routine healing: Secondary | ICD-10-CM

## 2024-09-08 NOTE — Progress Notes (Signed)
  Subjective:  Patient ID: Sheila Kerr, female    DOB: 1956/04/26,  MRN: [id number]  Chief Complaint  Patient presents with   Cast Removal    RM 1 Patient is for recasting.    DOS: 04/10/2024 Procedure: Left first MTP fusion with autograft and allograft  68 y.o. female returns for post-op check.  She returns for follow-up, no issues with cast  Review of Systems: Negative except as noted in the HPI. Denies N/V/F/Ch.   Objective:  There were no vitals filed for this visit. Body mass index is 33.89 kg/m. Constitutional Well developed. Well nourished.  Vascular Foot warm and well perfused. Capillary refill normal to all digits.  Calf is soft and supple, no posterior calf or knee pain, negative Homans' sign  Neurologic Normal speech. Oriented to person, place, and time. Epicritic sensation to light touch grossly present bilaterally.  Dermatologic Wound is healed plantar.  Prior skin abrasion is healed  Orthopedic: Improvement in edema on the left midfoot with no tenderness to palpation   Multiple view plain film radiographs: New radiographs taken today show some change alignment and some increase in bone callus formation IMPRESSION: 1. Transverse fracture of the midshaft of the first metatarsal with 3 mm of medial displacement and 5.5 mm dorsal displacement of the distal fracture fragment with respect to the proximal. 2. Previous solid fusion of the base of the first metatarsal with the medial cuneiform and first MTP joint. 3. 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 4. Thickened Achilles tendon with small internal ossific structures, appearance favors Achilles tendinopathy. 5. Thickened plantar fascia proximally favoring plantar fasciitis. 6. Notable subcutaneous edema plantar to the fused fifth MTP joint. Cannot exclude ulceration along the ball of the foot in this vicinity. 7. Miniscule loculation of gas density  dorsal to the proximal second metatarsal, possibly a small focus of venous gas or nitrogen gas formation. Do not see the degree of surrounding inflammation that I would expect for an abscess.     Electronically Signed   By: Ryan Salvage M.D.   On: 07/31/2024 16:42 Assessment:   1. Closed displaced fracture of first metatarsal bone of left foot with routine healing, subsequent encounter     Plan:  Patient was evaluated and treated and all questions answered.  S/p foot surgery left - Cast left intact today for 1 more week.  We do not have total contact cast materials that are currently backordered.  We may have an alternative vendor for this next week and/or a walking fiberglass below-knee cast.  She will bring a walker to potentially utilize Return in 2 weeks (on 09/22/2024) for casting change.

## 2024-09-15 ENCOUNTER — Ambulatory Visit: Admitting: Podiatry

## 2024-09-15 DIAGNOSIS — S92312D Displaced fracture of first metatarsal bone, left foot, subsequent encounter for fracture with routine healing: Secondary | ICD-10-CM | POA: Diagnosis not present

## 2024-09-15 NOTE — Progress Notes (Signed)
  Subjective:  Patient ID: Sheila Kerr, female    DOB: 1956-10-29,  MRN: [id number]  No chief complaint on file.   DOS: 04/10/2024 Procedure: Left first MTP fusion with autograft and allograft  68 y.o. female returns for post-op check.  She returns for follow-up, no issues with cast  Review of Systems: Negative except as noted in the HPI. Denies N/V/F/Ch.   Objective:  There were no vitals filed for this visit. There is no height or weight on file to calculate BMI. Constitutional Well developed. Well nourished.  Vascular Foot warm and well perfused. Capillary refill normal to all digits.  Calf is soft and supple, no posterior calf or knee pain, negative Homans' sign  Neurologic Normal speech. Oriented to person, place, and time. Epicritic sensation to light touch grossly present bilaterally.  Dermatologic Wound is healed plantar.  Prior skin abrasion is healed  Orthopedic: Improvement in edema on the left midfoot with no tenderness to palpation   Multiple view plain film radiographs: New radiographs taken today show some change alignment and some increase in bone callus formation IMPRESSION: 1. Transverse fracture of the midshaft of the first metatarsal with 3 mm of medial displacement and 5.5 mm dorsal displacement of the distal fracture fragment with respect to the proximal. 2. Previous solid fusion of the base of the first metatarsal with the medial cuneiform and first MTP joint. 3. 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 4. Thickened Achilles tendon with small internal ossific structures, appearance favors Achilles tendinopathy. 5. Thickened plantar fascia proximally favoring plantar fasciitis. 6. Notable subcutaneous edema plantar to the fused fifth MTP joint. Cannot exclude ulceration along the ball of the foot in this vicinity. 7. Miniscule loculation of gas density dorsal to the proximal second metatarsal,  possibly a small focus of venous gas or nitrogen gas formation. Do not see the degree of surrounding inflammation that I would expect for an abscess.     Electronically Signed   By: Ryan Salvage M.D.   On: 07/31/2024 16:42 Assessment:   1. Closed displaced fracture of first metatarsal bone of left foot with routine healing, subsequent encounter      Plan:  Patient was evaluated and treated and all questions answered.  S/p foot surgery left - Total contact cast change today.  A new boot had to be dispensed to fit this new cast.  Follow-up with me in 1 week for cast change.  Ultimately I expect she likely is going to need a metatarsal osteotomy to realign the lesser metatarsals to the new position of the first. No follow-ups on file.

## 2024-09-22 ENCOUNTER — Ambulatory Visit (INDEPENDENT_AMBULATORY_CARE_PROVIDER_SITE_OTHER)

## 2024-09-22 ENCOUNTER — Ambulatory Visit: Admitting: Podiatry

## 2024-09-22 DIAGNOSIS — S92312D Displaced fracture of first metatarsal bone, left foot, subsequent encounter for fracture with routine healing: Secondary | ICD-10-CM

## 2024-09-25 ENCOUNTER — Other Ambulatory Visit: Payer: Self-pay | Admitting: Family Medicine

## 2024-09-25 NOTE — Progress Notes (Signed)
  Subjective:  Patient ID: Sheila Kerr, female    DOB: Jun 27, 1956,  MRN: [id number]  No chief complaint on file.   DOS: 04/10/2024 Procedure: Left first MTP fusion with autograft and allograft  68 y.o. female returns for post-op check.  She returns for follow-up this cast has been quite difficult for her compared to the old cast type.  Review of Systems: Negative except as noted in the HPI. Denies N/V/F/Ch.   Objective:  There were no vitals filed for this visit. There is no height or weight on file to calculate BMI. Constitutional Well developed. Well nourished.  Vascular Foot warm and well perfused. Capillary refill normal to all digits.  Calf is soft and supple, no posterior calf or knee pain, negative Homans' sign  Neurologic Normal speech. Oriented to person, place, and time. Epicritic sensation to light touch grossly present bilaterally.  Dermatologic Wound is healed plantar.  Some bruising under third metatarsal prior skin abrasion is healed  Orthopedic: Improvement in edema on the left midfoot with no tenderness to palpation   Multiple view plain film radiographs: New radiographs taken today show unchanged alignment and increasing bone callus formation  IMPRESSION: 1. Transverse fracture of the midshaft of the first metatarsal with 3 mm of medial displacement and 5.5 mm dorsal displacement of the distal fracture fragment with respect to the proximal. 2. Previous solid fusion of the base of the first metatarsal with the medial cuneiform and first MTP joint. 3. 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 4. Thickened Achilles tendon with small internal ossific structures, appearance favors Achilles tendinopathy. 5. Thickened plantar fascia proximally favoring plantar fasciitis. 6. Notable subcutaneous edema plantar to the fused fifth MTP joint. Cannot exclude ulceration along the ball of the foot in  this vicinity. 7. Miniscule loculation of gas density in alignment metatarsal, possibly a small focus of venous gas or nitrogen gas formation. Do not see the degree of surrounding inflammation that I would expect for an abscess.     Electronically Signed   By: Ryan Salvage M.D.   On: 07/31/2024 16:42 Assessment:   1. Closed displaced fracture of first metatarsal bone of left foot with routine healing, subsequent encounter      Plan:  Patient was evaluated and treated and all questions answered.  S/p foot surgery left - Cast removed today.  Due to the difficulty she had over the last cast we did not reapply a new cast today.  Recommend she continue to be minimally ambulatory in the cam walker boot she has at home she do not have it with her today so a surgical shoe was dispensed.  I do think long-term she will need a metatarsal osteotomy of the third metatarsal to alleviate any pressure here.  Hopefully can continue to heal the bone at the first TMT.  Follow-up with me in 1 week to reevaluate  No follow-ups on file.

## 2024-09-29 ENCOUNTER — Ambulatory Visit: Admitting: Podiatry

## 2024-09-29 ENCOUNTER — Ambulatory Visit (INDEPENDENT_AMBULATORY_CARE_PROVIDER_SITE_OTHER)

## 2024-09-29 VITALS — Ht 66.0 in | Wt 210.0 lb

## 2024-09-29 DIAGNOSIS — S92312K Displaced fracture of first metatarsal bone, left foot, subsequent encounter for fracture with nonunion: Secondary | ICD-10-CM

## 2024-09-29 DIAGNOSIS — S92312D Displaced fracture of first metatarsal bone, left foot, subsequent encounter for fracture with routine healing: Secondary | ICD-10-CM | POA: Diagnosis not present

## 2024-09-29 NOTE — Progress Notes (Signed)
  Subjective:  Patient ID: Sheila Kerr, female    DOB: [birth year],  MRN: [id number]  Chief Complaint  Patient presents with   Post-op Follow-up    RM 1 Patient is here to f/u on left foot fracture. Pt states no pain in left.    DOS: 04/10/2024 Procedure: Left first MTP fusion with autograft and allograft  68 y.o. female returns for post-op check.  She returns for follow-up no worsening pain swelling or issues over the last week  Review of Systems: Negative except as noted in the HPI. Denies N/V/F/Ch.   Objective:  There were no vitals filed for this visit. Body mass index is 33.89 kg/m. Constitutional Well developed. Well nourished.  Vascular Foot warm and well perfused. Capillary refill normal to all digits.  Calf is soft and supple, no posterior calf or knee pain, negative Homans' sign  Neurologic Normal speech. Oriented to person, place, and time. Epicritic sensation to light touch grossly present bilaterally.  Dermatologic Wound is healed plantar.  Some bruising under third metatarsal, has improved  Orthopedic: I minimal edema on the left midfoot with no tenderness to palpation   Multiple view plain film radiographs: New radiographs taken today 09/29/2024 and compared to previous x-rays on 09/22/2024, 09/08/2024 and 08/25/2024 as well as CT scan 07/31/2024 and x-rays 07/30/2024 show unchanged alignment and incomplete fracture healing with fracture gap still visible there is hypertrophic bone callus formation, the fracture gap is less than 1 cm  IMPRESSION: 1. Transverse fracture of the midshaft of the first metatarsal with 3 mm of medial displacement and 5.5 mm dorsal displacement of the distal fracture fragment with respect to the proximal. 2. Previous solid fusion of the base of the first metatarsal with the medial cuneiform and first MTP joint. 3. 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 4. Thickened Achilles  tendon with small internal ossific structures, appearance favors Achilles tendinopathy. 5. Thickened plantar fascia proximally favoring plantar fasciitis. 6. Notable subcutaneous edema plantar to the fused fifth MTP joint. Cannot exclude ulceration along the ball of the foot in this vicinity. 7. Miniscule loculation of gas density in alignment metatarsal, possibly a small focus of venous gas or nitrogen gas formation. Do not see the degree of surrounding inflammation that I would expect for an abscess.     Electronically Signed   By: Ryan Salvage M.D.   On: 07/31/2024 16:42 Assessment:   1. 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  S/p foot surgery left - Continue partial weightbearing only in the cam walker boot for now.  Return in 4 weeks for new radiographs.  Her fracture has not healed in the last 2 months and I recommended noninvasive bone marrow stimulation to continue fracture consolidation and growth.  Referral sent to [facility] for a ultrasound bone healing system to be ordered.  Follow-up with me in 4 weeks to reevaluate.  Return in about 4 weeks (around 10/27/2024) for fx follow up (new left foot XR).

## 2024-10-03 ENCOUNTER — Other Ambulatory Visit: Payer: Self-pay | Admitting: Family Medicine

## 2024-10-29 ENCOUNTER — Ambulatory Visit

## 2024-10-29 ENCOUNTER — Ambulatory Visit: Admitting: Podiatry

## 2024-10-29 ENCOUNTER — Telehealth: Payer: Self-pay

## 2024-10-29 VITALS — Ht 66.0 in | Wt 210.0 lb

## 2024-10-29 DIAGNOSIS — S92312K Displaced fracture of first metatarsal bone, left foot, subsequent encounter for fracture with nonunion: Secondary | ICD-10-CM

## 2024-10-29 NOTE — Telephone Encounter (Signed)
 Pt has requested completion of Disability parking placard . Form was reviewed and signed by Provider Jeoffrey Barrio on 10/29/2024 . Pt was alerted by phone that form was completed. She stated that she would pick up form on 10/30/2024.

## 2024-11-01 NOTE — Progress Notes (Signed)
"  °  Subjective:  Patient ID: Sheila Kerr, female    DOB: 1956/06/26,  MRN: [id number]  Chief Complaint  Patient presents with   Fracture    Rm 20 Patient is here to f/u on left foot fracture. Pt states no pain or discomfort in let foot.    DOS: 04/10/2024 Procedure: Left first MTP fusion with autograft and allograft  68 y.o. female returns for post-op check.  She returns for follow-up doing fairly well  Review of Systems: Negative except as noted in the HPI. Denies N/V/F/Ch.   Objective:  There were no vitals filed for this visit. Body mass index is 33.89 kg/m. Constitutional Well developed. Well nourished.  Vascular Foot warm and well perfused. Capillary refill normal to all digits.  Calf is soft and supple, no posterior calf or knee pain, negative Homans' sign  Neurologic Normal speech. Oriented to person, place, and time. Epicritic sensation to light touch grossly present bilaterally.  Dermatologic Wounds remain healed.  Bruising has resolved under third metatarsal but the bone is prominent.  Orthopedic: I minimal edema on the left midfoot with no tenderness to palpation   Multiple view plain film radiographs: New radiographs taken today show interval increased density change to the bone callus  IMPRESSION: 1. Transverse fracture of the midshaft of the first metatarsal with 3 mm of medial displacement and 5.5 mm dorsal displacement of the distal fracture fragment with respect to the proximal. 2. Previous solid fusion of the base of the first metatarsal with the medial cuneiform and first MTP joint. 3. 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 4. Thickened Achilles tendon with small internal ossific structures, appearance favors Achilles tendinopathy. 5. Thickened plantar fascia proximally favoring plantar fasciitis. 6. Notable subcutaneous edema plantar to the fused fifth MTP joint. Cannot exclude ulceration  along the ball of the foot in this vicinity. 7. Miniscule loculation of gas density in alignment metatarsal, possibly a small focus of venous gas or nitrogen gas formation. Do not see the degree of surrounding inflammation that I would expect for an abscess.     Electronically Signed   By: Ryan Salvage M.D.   On: 07/31/2024 16:42 Assessment:   1. 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  S/p foot surgery left Has been using the bone marrow stimulator twice daily medially and plantar we reviewed its use for about 30 days now.  Recommend she continue to do this I would like to see her back in 1 month to reevaluate.  Continue CAM boot for now hopefully should be able to transition back gradually to stiff soled shoe gear and she had a good hiking boots with her today that we would move to.  Follow-up sooner if issues showing some progress finally.  Return in about 1 month (around 12/01/2024) for Left foot fracture follow-up and new x-rays.  "

## 2024-11-11 ENCOUNTER — Other Ambulatory Visit: Payer: Self-pay

## 2024-11-11 DIAGNOSIS — Z9189 Other specified personal risk factors, not elsewhere classified: Secondary | ICD-10-CM

## 2024-11-16 ENCOUNTER — Other Ambulatory Visit (HOSPITAL_COMMUNITY)

## 2024-11-18 ENCOUNTER — Ambulatory Visit (HOSPITAL_COMMUNITY)
Admission: RE | Admit: 2024-11-18 | Discharge: 2024-11-18 | Disposition: A | Discharge status: Home / Self Care | Source: Ambulatory Visit | Attending: Family Medicine | Admitting: Family Medicine

## 2024-11-18 DIAGNOSIS — M810 Age-related osteoporosis without current pathological fracture: Secondary | ICD-10-CM | POA: Diagnosis not present

## 2024-11-18 DIAGNOSIS — Z9189 Other specified personal risk factors, not elsewhere classified: Secondary | ICD-10-CM | POA: Diagnosis present

## 2024-11-24 ENCOUNTER — Ambulatory Visit: Payer: Self-pay | Admitting: Family Medicine

## 2024-11-24 DIAGNOSIS — M810 Age-related osteoporosis without current pathological fracture: Secondary | ICD-10-CM

## 2024-12-01 ENCOUNTER — Ambulatory Visit: Admitting: Podiatry

## 2024-12-01 ENCOUNTER — Ambulatory Visit

## 2024-12-01 VITALS — Ht 66.0 in | Wt 210.0 lb

## 2024-12-01 DIAGNOSIS — S92312K Displaced fracture of first metatarsal bone, left foot, subsequent encounter for fracture with nonunion: Secondary | ICD-10-CM

## 2024-12-03 NOTE — Progress Notes (Signed)
"  °  Subjective:  Patient ID: Sheila Kerr, female    DOB: 1955-11-13,  MRN: [id number]  Chief Complaint  Patient presents with   Fracture    RM 4 Return in about 1 month (around 12/01/2024) for Left foot fracture follow-up and new x-rays. Pt states left foot feels stronger, started wearing regular shoes for the past three days.    DOS: 04/10/2024 Procedure: Left first MTP fusion with autograft and allograft  69 y.o. female returns for post-op check.  She returns for follow-up doing fairly well, not have any pain.  Feels more stable.  She has used the bone stim for about 52 days now  Review of Systems: Negative except as noted in the HPI. Denies N/V/F/Ch.   Objective:  There were no vitals filed for this visit. Body mass index is 33.89 kg/m. Constitutional Well developed. Well nourished.  Vascular Foot warm and well perfused. Capillary refill normal to all digits.  Calf is soft and supple, no posterior calf or knee pain, negative Homans' sign  Neurologic Normal speech. Oriented to person, place, and time. Epicritic sensation to light touch grossly present bilaterally.  Dermatologic Wounds remain healed.  No bruising or preulcerative callus third metatarsal.  Bone still prominent.  Orthopedic: She has no edema or pain to palpation around the fracture site   Multiple view plain film radiographs: New radiographs taken today show interval increased density change to the bone callus, improved since last visit  IMPRESSION: 1. Transverse fracture of the midshaft of the first metatarsal with 3 mm of medial displacement and 5.5 mm dorsal displacement of the distal fracture fragment with respect to the proximal. 2. Previous solid fusion of the base of the first metatarsal with the medial cuneiform and first MTP joint. 3. 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 4. Thickened Achilles tendon with small internal ossific  structures, appearance favors Achilles tendinopathy. 5. Thickened plantar fascia proximally favoring plantar fasciitis. 6. Notable subcutaneous edema plantar to the fused fifth MTP joint. Cannot exclude ulceration along the ball of the foot in this vicinity. 7. Miniscule loculation of gas density in alignment metatarsal, possibly a small focus of venous gas or nitrogen gas formation. Do not see the degree of surrounding inflammation that I would expect for an abscess.     Electronically Signed   By: Ryan Salvage M.D.   On: 07/31/2024 16:42 Assessment:   1. 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  S/p foot surgery left Stable and improving and continues to consolidate.  Continues in the stimulator until 90 days.  Okay to transition to a stiff soled hiking boot as tolerated.  Follow-up in 6 weeks to reevaluate.  She was recently diagnosed with osteoporosis and is being treated for this.  Already on vitamin D .  Endocrine referral has been placed may be a good candidate for Prolia or alendronate therapy, I would consider this an insufficiency fracture that she has undergone.  Return in about 6 weeks (around 01/12/2025) for Fracture f/u (new left foot XR).  "

## 2024-12-08 ENCOUNTER — Telehealth: Payer: Self-pay

## 2024-12-08 NOTE — Telephone Encounter (Signed)
 Copied from CRM [id number]. 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 Wanted to know if the fax came through and was sent over.  Phone: [phone] ext. [phone] Fax: [phone]

## 2024-12-10 NOTE — Telephone Encounter (Signed)
 Returned call no answer . Lvm for call back . Will try later.

## 2024-12-11 NOTE — Telephone Encounter (Signed)
 Second attempt to reach out. No answer. Lvm. For call bck .

## 2024-12-14 ENCOUNTER — Other Ambulatory Visit: Payer: Self-pay | Admitting: Family Medicine

## 2024-12-14 NOTE — Telephone Encounter (Unsigned)
 Copied from CRM [id number]. Topic: Clinical - Medication Refill >> Dec 14, 2024 10:09 AM Delon DASEN wrote: Medication: ezetimibe (ZETIA) 10 MG tablet  Has the patient contacted their pharmacy? No (Agent: If no, request that the patient contact the pharmacy for the refill. If patient does not wish to contact the pharmacy document the reason why and proceed with request.) (Agent: If yes, when and what did the pharmacy advise?)  This is the patient's preferred pharmacy:    [facility] - [place], [place] - [address] [place] [address] Phone: [phone] Fax: [phone]  Is this the correct pharmacy for this prescription? Yes If no, delete pharmacy and type the correct one.   Has the prescription been filled recently? Yes  Is the patient out of the medication? No  Has the patient been seen for an appointment in the last year OR does the patient have an upcoming appointment? Yes  Can we respond through MyChart? Yes  Agent: Please be advised that Rx refills may take up to 3 business days. We ask that you follow-up with your pharmacy.

## 2024-12-15 NOTE — Telephone Encounter (Signed)
 Requested medication (s) are due for refill today: yes  Requested medication (s) are on the active medication list: no  Last refill:  07/26/20  Future visit scheduled: yes  Notes to clinic:  historical medication     Requested Prescriptions  Pending Prescriptions Disp Refills   ezetimibe (ZETIA) 10 MG tablet       Cardiovascular:  Antilipid - Sterol Transport Inhibitors Failed - 12/15/2024  2:29 PM      Failed - Lipid Panel in normal range within the last 12 months    Cholesterol  Date Value Ref Range Status  08/17/2024 195 <200 mg/dL Final   LDL Cholesterol (Calc)  Date Value Ref Range Status  08/17/2024 115 (H) mg/dL (calc) Final    Comment:    Reference range: <100 . Desirable range <100 mg/dL for primary prevention;   <70 mg/dL for patients with CHD or diabetic patients  with > or = 2 CHD risk factors. [facility] LDL-C is now calculated using the Martin-Hopkins  calculation, which is a validated novel method providing  better accuracy than the Friedewald equation in the  estimation of LDL-C.  Gladis APPLETHWAITE et al. SANDREA. 7986;689(80): 2061-2068  (http://education.[facility].com/faq/FAQ164)    HDL  Date Value Ref Range Status  08/17/2024 45 (L) > OR = 50 mg/dL Final   Triglycerides  Date Value Ref Range Status  08/17/2024 234 (H) <150 mg/dL Final    Comment:    . If a non-fasting specimen was collected, consider repeat triglyceride testing on a fasting specimen if clinically indicated.  Veatrice et al. J. of Clin. Lipidol. 2015;9:129-169. [facility]          Passed - AST in normal range and within 360 days    AST  Date Value Ref Range Status  08/17/2024 13 10 - 35 U/L Final         Passed - ALT in normal range and within 360 days    ALT  Date Value Ref Range Status  08/17/2024 14 6 - 29 U/L Final         Passed - Patient is not pregnant      Passed - Valid encounter within last 12 months    Recent Outpatient Visits           3 months ago Primary hypertension    [place] [facility] Kayla Jeoffrey RAMAN, FNP   7 months ago Primary hypertension   [place] [facility] Kayla Jeoffrey RAMAN, FNP   9 months ago Physical exam, annual   [place] [facility] Kayla Jeoffrey RAMAN, FNP   1 year ago Primary hypertension   [place] [facility] Kayla Jeoffrey RAMAN, [place]

## 2024-12-16 NOTE — Telephone Encounter (Signed)
 Multiple attempts made to contact nurse lori. Several vm left and no contact back has been made. I lvm stating that I was considering this case as complete and would be closing this request out.

## 2024-12-18 NOTE — Telephone Encounter (Signed)
 Spoke to pt by phone . She states that  [facility] sent her several 90 day bottles of the old prescription, she had multiple bottles in  back up.   She stopped taking the medication  for a while and recently started taking it again but now she is out of it.  She was taking it at the 10 mg. Of the Zetia.   She also stated that she has started taking the berberine supplement that you suggested. Pt. Is scheduled for fasting labs on 12/22/2024 .   MA expressed that provider would refill only after labs were received and a decision was made on need for medication  and appropriate dosage, that she would receive a call or message at the time results were received .

## 2024-12-22 ENCOUNTER — Other Ambulatory Visit

## 2025-01-12 ENCOUNTER — Ambulatory Visit: Admitting: Podiatry

## 2025-03-03 ENCOUNTER — Other Ambulatory Visit

## 2025-03-10 ENCOUNTER — Encounter: Admitting: Family Medicine
